# Patient Record
Sex: Female | Born: 1950
Health system: Southern US, Community
[De-identification: ages and names within clinical notes are randomized; demographics above are authoritative.]

## PROBLEM LIST (undated history)

## (undated) DIAGNOSIS — Z9889 Other specified postprocedural states: Secondary | ICD-10-CM

## (undated) DIAGNOSIS — R112 Nausea with vomiting, unspecified: Secondary | ICD-10-CM

## (undated) DIAGNOSIS — M1990 Unspecified osteoarthritis, unspecified site: Secondary | ICD-10-CM

## (undated) DIAGNOSIS — Z87442 Personal history of urinary calculi: Secondary | ICD-10-CM

## (undated) DIAGNOSIS — G4700 Insomnia, unspecified: Secondary | ICD-10-CM

## (undated) DIAGNOSIS — C539 Malignant neoplasm of cervix uteri, unspecified: Secondary | ICD-10-CM

## (undated) DIAGNOSIS — E785 Hyperlipidemia, unspecified: Secondary | ICD-10-CM

## (undated) DIAGNOSIS — H269 Unspecified cataract: Secondary | ICD-10-CM

## (undated) DIAGNOSIS — F329 Major depressive disorder, single episode, unspecified: Secondary | ICD-10-CM

## (undated) DIAGNOSIS — K219 Gastro-esophageal reflux disease without esophagitis: Secondary | ICD-10-CM

## (undated) DIAGNOSIS — Z8541 Personal history of malignant neoplasm of cervix uteri: Secondary | ICD-10-CM

## (undated) DIAGNOSIS — R310 Gross hematuria: Secondary | ICD-10-CM

## (undated) DIAGNOSIS — C679 Malignant neoplasm of bladder, unspecified: Secondary | ICD-10-CM

## (undated) DIAGNOSIS — I10 Essential (primary) hypertension: Secondary | ICD-10-CM

## (undated) HISTORY — DX: Hyperlipidemia, unspecified: E78.5

## (undated) HISTORY — DX: Gastro-esophageal reflux disease without esophagitis: K21.9

## (undated) HISTORY — DX: Unspecified cataract: H26.9

## (undated) HISTORY — DX: Malignant neoplasm of cervix uteri, unspecified: C53.9

---

## 1981-08-27 HISTORY — PX: ABDOMINAL HYSTERECTOMY: SHX81

## 1983-08-28 HISTORY — PX: PERCUTANEOUS NEPHROSTOLITHOTOMY: SHX2207

## 1998-03-09 ENCOUNTER — Other Ambulatory Visit: Admission: RE | Admit: 1998-03-09 | Discharge: 1998-03-09 | Payer: Self-pay | Admitting: Obstetrics and Gynecology

## 2001-04-24 ENCOUNTER — Encounter: Payer: Self-pay | Admitting: Neurosurgery

## 2001-04-24 ENCOUNTER — Encounter: Admission: RE | Admit: 2001-04-24 | Discharge: 2001-04-24 | Payer: Self-pay | Admitting: Neurosurgery

## 2001-05-08 ENCOUNTER — Encounter: Admission: RE | Admit: 2001-05-08 | Discharge: 2001-05-08 | Payer: Self-pay | Admitting: Neurosurgery

## 2001-05-08 ENCOUNTER — Encounter: Payer: Self-pay | Admitting: Neurosurgery

## 2001-05-23 ENCOUNTER — Encounter: Admission: RE | Admit: 2001-05-23 | Discharge: 2001-05-23 | Payer: Self-pay | Admitting: Neurosurgery

## 2001-06-08 ENCOUNTER — Encounter: Payer: Self-pay | Admitting: Neurosurgery

## 2001-06-08 ENCOUNTER — Ambulatory Visit (HOSPITAL_COMMUNITY): Admission: RE | Admit: 2001-06-08 | Discharge: 2001-06-08 | Payer: Self-pay | Admitting: Neurosurgery

## 2001-07-30 ENCOUNTER — Encounter: Payer: Self-pay | Admitting: Neurosurgery

## 2001-08-01 ENCOUNTER — Encounter: Payer: Self-pay | Admitting: Neurosurgery

## 2001-08-01 ENCOUNTER — Inpatient Hospital Stay (HOSPITAL_COMMUNITY): Admission: RE | Admit: 2001-08-01 | Discharge: 2001-08-02 | Payer: Self-pay | Admitting: Neurosurgery

## 2001-08-02 HISTORY — PX: LAMINECTOMY AND MICRODISCECTOMY LUMBAR SPINE: SHX1913

## 2001-09-11 ENCOUNTER — Encounter: Payer: Self-pay | Admitting: Neurosurgery

## 2001-09-11 ENCOUNTER — Ambulatory Visit (HOSPITAL_COMMUNITY): Admission: RE | Admit: 2001-09-11 | Discharge: 2001-09-11 | Payer: Self-pay | Admitting: Neurosurgery

## 2001-09-23 ENCOUNTER — Encounter: Payer: Self-pay | Admitting: Neurosurgery

## 2001-09-23 ENCOUNTER — Inpatient Hospital Stay (HOSPITAL_COMMUNITY): Admission: RE | Admit: 2001-09-23 | Discharge: 2001-09-27 | Payer: Self-pay | Admitting: Neurosurgery

## 2001-09-23 HISTORY — PX: OTHER SURGICAL HISTORY: SHX169

## 2002-04-09 ENCOUNTER — Encounter: Payer: Self-pay | Admitting: Neurosurgery

## 2002-04-09 ENCOUNTER — Encounter: Admission: RE | Admit: 2002-04-09 | Discharge: 2002-04-09 | Payer: Self-pay | Admitting: Neurosurgery

## 2002-08-26 ENCOUNTER — Other Ambulatory Visit: Admission: RE | Admit: 2002-08-26 | Discharge: 2002-08-26 | Payer: Self-pay | Admitting: Gynecology

## 2003-08-25 ENCOUNTER — Other Ambulatory Visit: Admission: RE | Admit: 2003-08-25 | Discharge: 2003-08-25 | Payer: Self-pay | Admitting: Gynecology

## 2009-05-31 ENCOUNTER — Other Ambulatory Visit: Admission: RE | Admit: 2009-05-31 | Discharge: 2009-05-31 | Payer: Self-pay | Admitting: Family Medicine

## 2010-08-14 ENCOUNTER — Encounter: Payer: Self-pay | Admitting: Internal Medicine

## 2010-09-06 ENCOUNTER — Encounter (INDEPENDENT_AMBULATORY_CARE_PROVIDER_SITE_OTHER): Payer: Self-pay | Admitting: *Deleted

## 2010-09-07 ENCOUNTER — Ambulatory Visit
Admission: RE | Admit: 2010-09-07 | Discharge: 2010-09-07 | Payer: Self-pay | Source: Home / Self Care | Attending: Internal Medicine | Admitting: Internal Medicine

## 2010-09-20 ENCOUNTER — Other Ambulatory Visit: Payer: Self-pay | Admitting: Internal Medicine

## 2010-09-20 ENCOUNTER — Ambulatory Visit
Admission: RE | Admit: 2010-09-20 | Discharge: 2010-09-20 | Payer: Self-pay | Source: Home / Self Care | Attending: Internal Medicine | Admitting: Internal Medicine

## 2010-09-25 ENCOUNTER — Encounter: Payer: Self-pay | Admitting: Internal Medicine

## 2010-09-28 NOTE — Miscellaneous (Signed)
Summary: [facility] Previsit/prep  Clinical Lists Changes  Medications: Added new medication of MOVIPREP 100 GM  SOLR (PEG-KCL-NACL-NASULF-NA ASC-C) As per prep instructions. - Signed Rx of MOVIPREP 100 GM  SOLR (PEG-KCL-NACL-NASULF-NA ASC-C) As per prep instructions.;  #1 x 0;  Signed;  Entered by: Wyona Almas RN;  Authorized by: Hilarie Fredrickson MD;  Method used: Electronically to [id number]*, [address] [place], [place], [place]  [address], Ph: [phone], Fax: [phone] Observations: Added new observation of NKA: T (09/07/2010 15:38)    Prescriptions: MOVIPREP 100 GM  SOLR (PEG-KCL-NACL-NASULF-NA ASC-C) As per prep instructions.  #1 x 0   Entered by:   Wyona Almas RN   Authorized by:   Hilarie Fredrickson MD   Signed by:   Wyona Almas RN on 09/07/2010   Method used:   Electronically to        [facility] [phone]* (retail)       [address] [place], [place]  [address]       Ph: [phone]       Fax: [phone]   RxID:   [id number]

## 2010-09-28 NOTE — Letter (Signed)
Summary: [facility] Instructions  [facility]  [address] [place], [place] [address]   Phone: [phone]  Fax: [phone]       BRENNAH QURAISHI    1951/06/06    MRN: [id number]        Procedure Day Dorna Bloom:  [facility]  09/20/10     Arrival Time:  9:30AM     Procedure Time:  10:30AM     Location of Procedure:                    _ X_  [facility] (4th Floor)                      PREPARATION FOR COLONOSCOPY WITH MOVIPREP   Starting 5 days prior to your procedure 09/15/10 do not eat nuts, seeds, popcorn, corn, beans, peas,  salads, or any raw vegetables.  Do not take any fiber supplements (e.g. Metamucil, Citrucel, and Benefiber).  THE DAY BEFORE YOUR PROCEDURE         DATE: 09/19/10  DAY: TUESDAY  1.  Drink clear liquids the entire day-NO SOLID FOOD  2.  Do not drink anything colored red or purple.  Avoid juices with pulp.  No orange juice.  3.  Drink at least 64 oz. (8 glasses) of fluid/clear liquids during the day to prevent dehydration and help the prep work efficiently.  CLEAR LIQUIDS INCLUDE: Water Jello Ice Popsicles Tea (sugar ok, no milk/cream) Powdered fruit flavored drinks Coffee (sugar ok, no milk/cream) Gatorade Juice: apple, white grape, white cranberry  Lemonade Clear bullion, consomm, broth Carbonated beverages (any kind) Strained chicken noodle soup Hard Candy                             4.  In the morning, mix first dose of MoviPrep solution:    Empty 1 Pouch A and 1 Pouch B into the disposable container    Add lukewarm drinking water to the top line of the container. Mix to dissolve    Refrigerate (mixed solution should be used within 24 hrs)  5.  Begin drinking the prep at 5:00 p.m. The MoviPrep container is divided by 4 marks.   Every 15 minutes drink the solution down to the next mark (approximately 8 oz) until the full liter is complete.   6.  Follow completed prep with 16 oz of clear liquid of your choice  (Nothing red or purple).  Continue to drink clear liquids until bedtime.  7.  Before going to bed, mix second dose of MoviPrep solution:    Empty 1 Pouch A and 1 Pouch B into the disposable container    Add lukewarm drinking water to the top line of the container. Mix to dissolve    Refrigerate  THE DAY OF YOUR PROCEDURE      DATE: 09/20/10  DAY: WEDNESDAY  Beginning at 5:30AM (5 hours before procedure):         1. Every 15 minutes, drink the solution down to the next mark (approx 8 oz) until the full liter is complete.  2. Follow completed prep with 16 oz. of clear liquid of your choice.    3. You may drink clear liquids until 8:30AM (2 HOURS BEFORE PROCEDURE).   MEDICATION INSTRUCTIONS  Unless otherwise instructed, you should take regular prescription medications with a small sip of water   as early as possible the morning of your  procedure.   Additional medication instructions: Hold Maxzide HCL the morning of procedure.         OTHER INSTRUCTIONS  You will need a responsible adult at least 59 years of age to accompany you and drive you home.   This person must remain in the waiting room during your procedure.  Wear loose fitting clothing that is easily removed.  Leave jewelry and other valuables at home.  However, you may wish to bring a book to read or  an iPod/MP3 player to listen to music as you wait for your procedure to start.  Remove all body piercing jewelry and leave at home.  Total time from sign-in until discharge is approximately 2-3 hours.  You should go home directly after your procedure and rest.  You can resume normal activities the  day after your procedure.  The day of your procedure you should not:   Drive   Make legal decisions   Operate machinery   Drink alcohol   Return to work  You will receive specific instructions about eating, activities and medications before you leave.    The above instructions have been reviewed and  explained to me by   Wyona Almas RN  September 07, 2010 4:31 PM     I fully understand and can verbalize these instructions _____________________________ Date _________

## 2010-09-28 NOTE — Letter (Signed)
Summary: Pre Visit Letter Revised  [facility]  [address] [place], [place] [address]   Phone: [phone]  Fax: [phone]        08/14/2010 MRN: [id number]  [facility] [address] [place], [place]  [address]             Procedure Date:  1-25- 10:30am           Dr Marina Goodell   Welcome to the Gastroenterology Division at [facility].    You are scheduled to see a nurse for your pre-procedure visit on 09-06-10 at 3:30pm on the 3rd floor at [facility], [address]  We ask that you try to arrive at our office 15 minutes prior to your appointment time to allow for check-in.  Please take a minute to review the attached form.  If you answer "Yes" to one or more of the questions on the first page, we ask that you call the person listed at your earliest opportunity.  If you answer "No" to all of the questions, please complete the rest of the form and bring it to your appointment.    Your nurse visit will consist of discussing your medical and surgical history, your immediate family medical history, and your medications.   If you are unable to list all of your medications on the form, please bring the medication bottles to your appointment and we will list them.  We will need to be aware of both prescribed and over the counter drugs.  We will need to know exact dosage information as well.    Please be prepared to read and sign documents such as consent forms, a financial agreement, and acknowledgement forms.  If necessary, and with your consent, a friend or relative is welcome to sit-in on the nurse visit with you.  Please bring your insurance card so that we may make a copy of it.  If your insurance requires a referral to see a specialist, please bring your referral form from your primary care physician.  No co-pay is required for this nurse visit.     If you cannot keep your appointment, please call [phone] to cancel or reschedule prior to your  appointment date.  This allows [place] the opportunity to schedule an appointment for another patient in need of care.    Thank you for choosing [facility] for your medical needs.  We appreciate the opportunity to care for you.  Please visit [place] at our website  to learn more about our practice.  Sincerely, The Gastroenterology Division

## 2010-09-28 NOTE — Procedures (Addendum)
Summary: Colonoscopy  Patient: [name] Note: All result statuses are Final unless otherwise noted.  Tests: (1) Colonoscopy (COL)   COL Colonoscopy           DONE     [facility]     [address]     [place], [place]  [address]           COLONOSCOPY PROCEDURE REPORT           PATIENT:  Amy Villanueva, Amy Villanueva  MR#:  [id number]     BIRTHDATE:  1950/11/16, 59 yrs. old  GENDER:  female     ENDOSCOPIST:  Wilhemina Bonito. Eda Keys, MD     REF. BY:  Benedetto Goad, MD     PROCEDURE DATE:  09/20/2010     PROCEDURE:  Colonoscopy with snare polypectomy x 1     ASA CLASS:  Class II     INDICATIONS:  Routine Risk Screening     MEDICATIONS:   Fentanyl 100 mcg IV, Versed 10 mg IV, Benadryl 25     mg IV           DESCRIPTION OF PROCEDURE:   After the risks benefits and     alternatives of the procedure were thoroughly explained, informed     consent was obtained.  Digital rectal exam was performed and     revealed no abnormalities.   The LB CF-H180AL [id number] endoscope     was introduced through the anus and advanced to the cecum, which     was identified by both the appendix and ileocecal valve, without     limitations.Time to cecum = 5:11 min.  The quality of the prep was     excellent, using MoviPrep.  The instrument was then slowly     withdrawn (time = 12:44 min) as the colon was fully examined.     <<PROCEDUREIMAGES>>           FINDINGS:  A diminutive polyp was found in the rectum. Polyp was     snared without cautery. Retrieval was successful.   Moderate     diverticulosis was found in the sigmoid colon. This was otherwise     a normal complete colonoscopy.  Retroflexed views in the rectum     revealed internal hemorrhoids.    The scope was then withdrawn     from the patient and the procedure completed.           COMPLICATIONS:  None           ENDOSCOPIC IMPRESSION:     1) Diminutive polyp in the rectum - removed     2) Moderate diverticulosis in the sigmoid colon     3) Internal  hemorrhoids     RECOMMENDATIONS:     1) Repeat colonoscopy in 5 years if polyp adenomatous; otherwise     10 years           ______________________________     Wilhemina Bonito. Eda Keys, MD           CC:  Kathee Delton, PA ([facility]); The Patient           n.     eSIGNED:   Woodroe Vogan N. Eda Keys at 09/20/2010 12:18 PM           Janean Sark, [id number]  Note: An exclamation mark (!) indicates a result that was not dispersed into the flowsheet. Document Creation Date: 09/20/2010 12:19 PM _______________________________________________________________________  (1) Order result  status: Final Collection or observation date-time: 09/20/2010 12:10 Requested date-time:  Receipt date-time:  Reported date-time:  Referring Physician:   Ordering Physician: Fransico Setters [phone]) Specimen Source:  Source: Launa Grill Order Number: [id number] Lab site:   Appended Document: Colonoscopy recall 5 yrs     Procedures Next Due Date:    Colonoscopy: 08/2015

## 2010-10-04 NOTE — Letter (Signed)
Summary: Patient Notice- Polyp Results  [facility]  [address] [place], [place] [address]   Phone: [phone]  Fax: [phone]        September 25, 2010 MRN: [id number]    [facility] [address] [place], [place]  [address]    Dear Ms. Amy Villanueva,  I am pleased to inform you that the colon polyp(s) removed during your recent colonoscopy was (were) found to be benign (no cancer detected) upon pathologic examination.  I recommend you have a repeat colonoscopy examination in 5 years to look for recurrent polyps, as having colon polyps increases your risk for having recurrent polyps or even colon cancer in the future.  Should you develop new or worsening symptoms of abdominal pain, bowel habit changes or bleeding from the rectum or bowels, please schedule an evaluation with either your primary care physician or with me.    Additional information/recommendations:  __ No further action with gastroenterology is needed at this time. Please      follow-up with your primary care physician for your other healthcare      needs.   Please call us if you are having persistent problems or have questions about your condition that have not been fully answered at this time.  Sincerely,  Hilarie Fredrickson MD  This letter has been electronically signed by your physician.  Appended Document: Patient Notice- Polyp Results LETTER MAILED

## 2011-01-12 NOTE — Op Note (Signed)
[place]  Patient:    Amy Villanueva, Amy Villanueva Visit Number: [id number] MRN: [id number]          Service Type: SUR Location: 3000 3040 01 Attending Physician:  Emeterio Reeve Dictated by:   Payton Doughty, M.D. Proc. Date: 09/23/01 Admit Date:  09/23/2001                             Operative Report  PREOPERATIVE DIAGNOSES:  Recurrent disk on the right side at 4-5, extraforaminal disk at 3-4 on the right side, and a small herniated disk at L5-S1 on the right side.  PROCEDURES:  L3-4, L4-5, and L5-S1 laminectomy, diskectomy, and posterior lumbar interbody fusion with Ray threaded fusion cage.  SURGEON:  Payton Doughty, M.D.  NURSE ASSISTANT:  [facility].  DOCTOR ASSISTANT:  Tanya Nones. Jeral Fruit, M.D.  ANESTHESIA:  General endotracheal.  PREP:  Shave and prepped, scrubbed with alcohol wipe.  COMPLICATIONS:  None.  DESCRIPTION OF PROCEDURE:  This is a 60 year old right-handed, white girl with severe lumbar spondylosis, recurrent disk at 4-5, and herniated disk at 3-4. She was taken to the operating room, smoothly anesthetized, intubated and placed prone on the operating table.  Following shave, prepped and draped in usual sterile fashion.  Skin was infiltrated with 1% lidocaine with 1:400,000 epinephrine.  Skin was incised from S1 to bottom of L2.  The lamina of L3, L4, L5, and S1 were exposed bilaterally in a subperiosteal plane out over the 5-1 facet joints.  Intraoperative x-ray confirmed correctness level.  Starting on the right side, the pars interticularis, lamina, and inferior facet of L3, L4, and L5 and the superior facet of L4, L5, and S1 were removed bilaterally and the ligamentum flavum removed.  This facilitated dissection and decompression of the right L3, L4, L5, and S1 nerve roots.  At L3-4, there was an extraforaminal disk, which was removed without difficulty and diskectomy was completed bilaterally.  At L4-5, there was a large disk  recurrence on the right side, which significantly elevated the right five root.  It also interfered with the four root as it entered the neural foramen.  This was removed without difficulty.  At L5-S1, there was a disk herniation on the right side at L5-S1, which significantly elevated the right S1 root.  This was removed without difficulty.  Bilateral diskectomies were completed.  A 14 x 21 mm Ray threaded fusion cages were then placed using the standard technique and protecting the nerve roots.  Intraoperative x-ray showed good placement of cages.  They were packed with bone graft harvested from the facet joints and capped.  The wound was irrigated and hemostasis assured.  The fascia was reapproximated with 0 Vicryl in an interrupted fashion, subcutaneous tissues were reapproximated with 0 Vicryl in an interrupted fashion, the subcuticular tissues were reapproximated with 3-0 Vicryl in an interrupted fashion.  The skin was closed with 3-0 nylon in a running lock fashion.  Betadine, Telfa dressing was applied and made occlusive with OpSite. The patient returned recovery room in good condition. Dictated by:   Payton Doughty, M.D. Attending Physician:  Emeterio Reeve DD:  09/23/01 TD:  09/23/01 Job: [id number]

## 2011-01-12 NOTE — Op Note (Signed)
[place]  Patient:    Amy Villanueva, Amy Villanueva Visit Number: [id number] MRN: [id number]          Service Type: SUR Location: 6700 6733 01 Attending Physician:  Emeterio Reeve Dictated by:   Payton Doughty, M.D. Proc. Date: 08/01/01 Admit Date:  08/01/2001 Discharge Date: 08/02/2001                             Operative Report  PREOPERATIVE DIAGNOSIS:  Herniated disk L5-S1 right.  POSTOPERATIVE DIAGNOSIS:  Herniated disk L5-S1 right.  OPERATION PERFORMED:  Right L5-S1 laminectomy and diskectomy.  SURGEON:  Payton Doughty, M.D.  ANESTHESIA:  General endotracheal.  PREP:  Sterile Betadine prep and scrub with alcohol wipe.  COMPLICATIONS:  None.  DESCRIPTION OF PROCEDURE:  The patient is a 60 year old female with a right L5 radiculopathy secondary to herniated disk at 4-5.  The patient was taken to the operating room, smoothly anesthetized and intubated, and placed prone on the operating table.  Following shave, prep and drape in the usual sterile fashion, the skin was infiltrated with 1% lidocaine with 1:400,000 epinephrine.  The skin was incised from mid L4 to mid L5 and the lamina of L4 exposed on the right side in the subperiosteal plane.  Intraoperative x-ray confirmed correctness of the level.  Hemisemilaminectomy of L4 was carried out to the top of the ligamentum flavum which was removed in retrograde fashion. This allowed dissection of the lateral margin of the right L5 root.  Once the nerve root was dissected free it was retracted medially with a DErrico retractor.  The herniated disk was immediately identified.  The few remaining annular fibers were incised and the disk material was evacuated.  This resulted in immediate decompression of the right L5 root.  The disk space was then carefully explored and all graspable fragments were removed and all marginally clinging fragments were curetted free and removed.  The wound was irrigated and  the nerve root once again explored.  Depo-Medrol soaked fat was used to fill the laminectomy defect.  The fascia was reapproximated with 0 Vicryl in interrupted fashion.  The subcutaneous tissues reapproximated with 3-0 Vicryl in interrupted fashion.  The subcuticular tissues reapproximated with 3-0 Vicryl in interrupted fashion.  The skin was closed with 4-0 Vicryl in running subcuticular fashion.  Benzoin were placed and made occlusive with Telfa and Op-Site.  The patient returned to the recovery room in good condition. Dictated by:   Payton Doughty, M.D. Attending Physician:  Emeterio Reeve DD:  08/01/01 TD:  08/02/01 Job: [id number]

## 2011-01-12 NOTE — Discharge Summary (Signed)
[place]  Patient:    MEHLANI, BLANKENBURG Visit Number: [id number] MRN: [id number]          Service Type: SUR Location: 3000 3040 01 Attending Physician:  Emeterio Reeve Dictated by:   Cristi Loron, M.D. Admit Date:  09/23/2001 Discharge Date: 09/27/2001                             Discharge Summary  Full full details of this admission, please refer to the typed history and physical.  BRIEF HISTORY:  The patient is a 60 year old white female who is about six weeks status post a diskectomy.  She had recurrent pain and was found to have recurrent disk herniation.  For further details of this admission, please refer to the typed history and physical.  HOSPITAL COURSE:  Dr. Channing Mutters admitted the patient to [facility] on September 23, 2001 and on the day of admission performed an L3-4/L4-5/L5-S1 laminectomy with diskectomy and placement of ray cages.  The surgery went well (for further details of this operation, please refer to the typed operative note).  POSTOPERATIVE COURSE:  The patients postoperative course was essentially unremarkable.  She was initially maintained on a PCA pump.  This was discontinued and p.o. Dilaudid was started.  By postop day #4, i.e. September 27, 2001, the patient was afebrile, vital signs were stable, and she was eating well and ambulating well.  Her wound was healing well without signs of infection.  She had normal motor strength and she was requesting discharge to home.  She was therefore discharged on September 27, 2001.  DISCHARGE INSTRUCTIONS:  The patient was given written discharge instructions, instructed to follow up with Dr. Channing Mutters in one week for suture removal.  DISCHARGE MEDICATIONS:  Dilaudid 2 mg #60 one p.o. q.4h. p.r.n. for pain.  No refills.  FINAL DIAGNOSIS:  Recurrent herniated disk L4-5 on the right, foraminal disk herniation at L3-4, and spondylosis at L5-X1.  PROCEDURE PERFORMED:   L3-4/L4-5/L5-S1 laminectomy, diskectomy, and posterior lumbar interbody fusion and placement of ray cages. Dictated by:   Cristi Loron, M.D. Attending Physician:  Emeterio Reeve DD:  09/27/01 TD:  09/29/01 Job: [id number]

## 2011-01-12 NOTE — H&P (Signed)
[place]  Patient:    Amy Villanueva, Amy Villanueva Visit Number: [id number] MRN: [id number]          Service Type: SUR Location: 3000 3040 01 Attending Physician:  Emeterio Reeve Dictated by:   Payton Doughty, M.D. Admit Date:  09/23/2001                           History and Physical  ADMITTING DIAGNOSES:  Recurrent herniated disk on the right at 4-5, foraminal disk at 3-4 on the 5 and spondylosis at L5-S1.  HISTORY OF PRESENT ILLNESS:  A 60 year old right-handed white lady who had a foraminal disk at 3-4, and a large herniated disk at 4-5.  She was operated on about 6 weeks ago and did relatively well and has had an increase in pain in her back and discomfort down her right leg.  AFter bed rest and a trial of steroids MRI was repeated which showed a disk recurrence of 4-5 on the right, an increase in size at 3-4 disk on the right as well as significant spondylosis at 5-1.  She is now admitted for fusion at these levels.  MEDICAL HISTORY:  Remarkable for kidney stones and she has had an operation on her bladder in 1986 and a hysterectomy in 1982.  MEDICATIONS:  Maxzide once a day for hypertension.  ALLERGIES:  She gets nauseated with all pain medications except for Dilaudid.  FAMILY HISTORY:  Her mom died at age 64 of breast cancer.  Her dad is 60 and in terrific health.  SOCIAL HISTORY:  She smokes a pack of cigarettes a day.  Drinks alcohol on a limited social basis.  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  REVIEW OF SYSTEMS:  Remarkable for hypertension, hypercholesterolemia, leg pain, kidney stones, leg weakness, back pain, arm pain, and neck pain.  She has a shoulder spur on the right.  PHYSICAL EXAMINATION:  HEENT:  Within normal limits.  NECK:  She has reasonable range of motion in her neck.  CHEST:  Clear.  CARDIAC:  Regular rate and rhythm.  ABDOMEN:  Nontender.  No  hepatosplenomegaly.  EXTREMITIES:  Without clubbing or cyanosis.  GU:  Deferred.  PERIPHERAL PULSES:  Good.  NEUROLOGIC:  She is awake, alert and oriented. Her cranial nerves are intact.  MOTOR EXAMINATION:  She has 5/5 strength throughout the upper and lower extremities with a mild amount of dorsal flexion and weakness on the right side.  She has right L4 and L5 sensory dysesthesias and mechanical back pain. Straight leg raise is positive on the right.  MRI RESULTS:  Have been reviewed.  CLINICAL IMPRESSION:  Lumbar spondylosis with large recurrent disk, foraminal disk at 3-4.  PLAN:  Lumbar laminectomy, diskectomy, and posterior lumbar interbody fusion at 3-4, 4-5, and 5-1.  The risks and benefits of this approach have been discussed with her and she wishes to proceed. Dictated by:   Payton Doughty, M.D. Attending Physician:  Emeterio Reeve DD:  09/23/01 TD:  09/23/01 Job: [id number]

## 2011-01-12 NOTE — H&P (Signed)
[place]  Patient:    Amy Villanueva, Amy Villanueva Visit Number: [id number] MRN: [id number]          Service Type: Attending:  Payton Doughty, M.D. Dictated by:   Payton Doughty, M.D. Adm. Date:  08/01/01                           History and Physical  ADMITTING DIAGNOSIS:  Herniated disc lumbar vertebrae-4/5.  SERVICE:  Neurosurgery.  HISTORY OF PRESENT ILLNESS:  This is a 60 year old right-handed white lady has been seen since August and June.  She started having pain in her back radiating down her right leg, getting to the side of foot with episodic numbness of the right foot.  There is no precipitating incidence.  The left is infected.  MRI demonstrated a foraminal disc at 3/4 and a disc at L4/5 with elevation of the right 5 root.  She participated in epidural steroids.  She had some increase in her pain.  She was restudied with a MR and it appeared as if the sides of the disc at 4/5 had increased and she is therefore admitted for a 4/5 discectomy.  PAST MEDICAL HISTORY:  Remarkable for a history of kidney stones which she has had basketed and an operation on her bladder April 1986.  She also had a hysterectomy in 1982.  MEDICATIONS:  Maxzide 1 q.d. for hypertension.  ALLERGIES:  She gets nauseated with ALL PAIN MEDICATIONS except for Dilaudid.  FAMILY HISTORY:  Her mom died at 1 with breast cancer.  Her dad is 48 and in terrific health.  SOCIAL HISTORY:  She smokes a pack of cigarettes a day and drinks alcohol on a limited social basis.  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  REVIEW OF SYSTEMS:  Remarkable for hypertension, hypercholesterolemia, leg pain, kidney stones, leg weakness, back pain, arm pain, leg pain, and neck pain.  Apparently, she has had a shoulder spur on the right.  PHYSICAL EXAMINATION:  HEENT:  Within normal limits.  NECK:  She has reasonable range of motion of her neck.  CHEST:   Clear.  CARDIOVASCULAR:  Regular rate and rhythm.  ABDOMEN:  Nontender.  No hepatosplenomegaly.  EXTREMITIES:  Without clubbing, cyanosis, or edema.  GENITOURINARY:  Deferred.  EXTREMITIES:  Peripheral pulses are good.  NEUROLOGICAL:  She is awake, alert, and oriented.  Her cranial nerves are intact.  Motor exam shows 5/5 strength throughout the upper and lower extremities with dorsiflexion weakness on the right and is pain limited. Sensory deficit is in the right L5 distribution.  Reflexes are a flicker at the knees, flicker at the right, 2 at the right ankle, flicker at the left ankle.  Straight leg raising is positive on the right.  LABORATORY DATA:  MR demonstrates as noted above, L3/4 and L4/5 disc and the L3/4 disc is in the extraforaminal position and would be anticipated to affect the L3 root.  The L4/5 disc is in a position to affect the L5 root and the patient has a right L5 radiculopathy.  PLAN:  The plan is for a lumbar laminectomy and discectomy on the right side at lumbar vertebrae-4/5.  The risks and benefits of this approach have been discussed with her and she wishes to proceed. Dictated by:   Payton Doughty, M.D. Attending:  Payton Doughty, M.D. DD:  08/01/01 TD:  08/01/01 Job: [id number]

## 2011-10-18 ENCOUNTER — Other Ambulatory Visit: Payer: Self-pay | Admitting: Urology

## 2011-10-18 ENCOUNTER — Encounter (HOSPITAL_BASED_OUTPATIENT_CLINIC_OR_DEPARTMENT_OTHER): Payer: Self-pay | Admitting: *Deleted

## 2011-10-18 NOTE — Progress Notes (Signed)
NPO AFTER MN. ARRIVES AT 0615. NEEDS ISTAT AND EKG. WILL TAKE PRILOSEC AM OF SURG. W/ SIP OF WATER.

## 2011-10-23 ENCOUNTER — Ambulatory Visit (HOSPITAL_BASED_OUTPATIENT_CLINIC_OR_DEPARTMENT_OTHER): Payer: PRIVATE HEALTH INSURANCE | Admitting: Anesthesiology

## 2011-10-23 ENCOUNTER — Encounter (HOSPITAL_BASED_OUTPATIENT_CLINIC_OR_DEPARTMENT_OTHER): Payer: Self-pay | Admitting: Anesthesiology

## 2011-10-23 ENCOUNTER — Encounter (HOSPITAL_BASED_OUTPATIENT_CLINIC_OR_DEPARTMENT_OTHER)
Admission: RE | Disposition: A | Discharge status: Home / Self Care | Payer: Self-pay | Source: Ambulatory Visit | Attending: Urology

## 2011-10-23 ENCOUNTER — Encounter (HOSPITAL_BASED_OUTPATIENT_CLINIC_OR_DEPARTMENT_OTHER): Payer: Self-pay | Admitting: *Deleted

## 2011-10-23 ENCOUNTER — Ambulatory Visit (HOSPITAL_BASED_OUTPATIENT_CLINIC_OR_DEPARTMENT_OTHER)
Admission: RE | Admit: 2011-10-23 | Discharge: 2011-10-23 | Disposition: A | Discharge status: Home / Self Care | Payer: PRIVATE HEALTH INSURANCE | Source: Ambulatory Visit | Attending: Urology | Admitting: Urology

## 2011-10-23 DIAGNOSIS — Z90710 Acquired absence of both cervix and uterus: Secondary | ICD-10-CM | POA: Insufficient documentation

## 2011-10-23 DIAGNOSIS — Z8541 Personal history of malignant neoplasm of cervix uteri: Secondary | ICD-10-CM | POA: Insufficient documentation

## 2011-10-23 DIAGNOSIS — C679 Malignant neoplasm of bladder, unspecified: Secondary | ICD-10-CM | POA: Insufficient documentation

## 2011-10-23 DIAGNOSIS — R310 Gross hematuria: Secondary | ICD-10-CM | POA: Insufficient documentation

## 2011-10-23 DIAGNOSIS — Z79899 Other long term (current) drug therapy: Secondary | ICD-10-CM | POA: Insufficient documentation

## 2011-10-23 DIAGNOSIS — D414 Neoplasm of uncertain behavior of bladder: Secondary | ICD-10-CM | POA: Insufficient documentation

## 2011-10-23 DIAGNOSIS — E7800 Pure hypercholesterolemia, unspecified: Secondary | ICD-10-CM | POA: Insufficient documentation

## 2011-10-23 DIAGNOSIS — I10 Essential (primary) hypertension: Secondary | ICD-10-CM | POA: Insufficient documentation

## 2011-10-23 HISTORY — DX: Unspecified osteoarthritis, unspecified site: M19.90

## 2011-10-23 HISTORY — DX: Major depressive disorder, single episode, unspecified: F32.9

## 2011-10-23 HISTORY — PX: CYSTOSCOPY WITH BIOPSY: SHX5122

## 2011-10-23 HISTORY — DX: Other specified postprocedural states: R11.2

## 2011-10-23 HISTORY — DX: Malignant neoplasm of bladder, unspecified: C67.9

## 2011-10-23 HISTORY — DX: Other specified postprocedural states: Z98.890

## 2011-10-23 HISTORY — DX: Gross hematuria: R31.0

## 2011-10-23 HISTORY — DX: Insomnia, unspecified: G47.00

## 2011-10-23 HISTORY — DX: Essential (primary) hypertension: I10

## 2011-10-23 HISTORY — DX: Personal history of urinary calculi: Z87.442

## 2011-10-23 HISTORY — DX: Personal history of malignant neoplasm of cervix uteri: Z85.41

## 2011-10-23 LAB — POCT I-STAT 4, (NA,K, GLUC, HGB,HCT)
Potassium: 3.4 mEq/L — ABNORMAL LOW (ref 3.5–5.1)
Sodium: 145 mEq/L (ref 135–145)

## 2011-10-23 SURGERY — CYSTOSCOPY, WITH BIOPSY
Anesthesia: General | Site: Bladder | Wound class: Clean Contaminated

## 2011-10-23 MED ORDER — STERILE WATER FOR IRRIGATION IR SOLN
Status: DC | PRN
  Administered 2011-10-23: 3000 mL

## 2011-10-23 MED ORDER — FENTANYL CITRATE 0.05 MG/ML IJ SOLN: 25.0000 ug | INTRAMUSCULAR | Status: DC | PRN

## 2011-10-23 MED ORDER — DROPERIDOL 2.5 MG/ML IJ SOLN
INTRAMUSCULAR | Status: DC | PRN
  Administered 2011-10-23: 0.625 mg via INTRAVENOUS

## 2011-10-23 MED ORDER — KETOROLAC TROMETHAMINE 30 MG/ML IJ SOLN: 15.0000 mg | Freq: Once | INTRAMUSCULAR | Status: DC | PRN

## 2011-10-23 MED ORDER — MIDAZOLAM HCL 5 MG/5ML IJ SOLN
INTRAMUSCULAR | Status: DC | PRN
  Administered 2011-10-23: 2 mg via INTRAVENOUS

## 2011-10-23 MED ORDER — GENTAMICIN IN SALINE 1.6-0.9 MG/ML-% IV SOLN
80.0000 mg | Freq: Once | INTRAVENOUS | Status: AC
  Administered 2011-10-23: 80 mg via INTRAVENOUS

## 2011-10-23 MED ORDER — FENTANYL CITRATE 0.05 MG/ML IJ SOLN
INTRAMUSCULAR | Status: DC | PRN
  Administered 2011-10-23: 50 ug via INTRAVENOUS

## 2011-10-23 MED ORDER — PROMETHAZINE HCL 25 MG/ML IJ SOLN: 6.2500 mg | INTRAMUSCULAR | Status: DC | PRN

## 2011-10-23 MED ORDER — DEXAMETHASONE SODIUM PHOSPHATE 4 MG/ML IJ SOLN
INTRAMUSCULAR | Status: DC | PRN
  Administered 2011-10-23: 8 mg via INTRAVENOUS

## 2011-10-23 MED ORDER — CIPROFLOXACIN HCL 250 MG PO TABS: 250.0000 mg | ORAL_TABLET | Freq: Two times a day (BID) | ORAL | Status: AC

## 2011-10-23 MED ORDER — PROPOFOL 10 MG/ML IV EMUL
INTRAVENOUS | Status: DC | PRN
  Administered 2011-10-23: 50 mg via INTRAVENOUS
  Administered 2011-10-23: 200 mg via INTRAVENOUS

## 2011-10-23 MED ORDER — LACTATED RINGERS IV SOLN
INTRAVENOUS | Status: DC
  Administered 2011-10-23: 07:00:00 via INTRAVENOUS

## 2011-10-23 MED ORDER — HYDROCODONE-ACETAMINOPHEN 5-500 MG PO TABS: 1.0000 | ORAL_TABLET | Freq: Four times a day (QID) | ORAL | Status: AC | PRN

## 2011-10-23 MED ORDER — LIDOCAINE HCL (CARDIAC) 20 MG/ML IV SOLN
INTRAVENOUS | Status: DC | PRN
  Administered 2011-10-23: 80 mg via INTRAVENOUS

## 2011-10-23 MED ORDER — ONDANSETRON HCL 4 MG/2ML IJ SOLN
INTRAMUSCULAR | Status: DC | PRN
  Administered 2011-10-23: 4 mg via INTRAVENOUS

## 2011-10-23 SURGICAL SUPPLY — 21 items
BAG DRAIN URO-CYSTO SKYTR STRL (DRAIN) ×2 IMPLANT
CANISTER SUCT LVC 12 LTR MEDI- (MISCELLANEOUS) ×2 IMPLANT
CATH ROBINSON RED A/P 12FR (CATHETERS) IMPLANT
CATH ROBINSON RED A/P 14FR (CATHETERS) IMPLANT
CLOTH BEACON ORANGE TIMEOUT ST (SAFETY) ×2 IMPLANT
DRAPE CAMERA CLOSED 9X96 (DRAPES) ×2 IMPLANT
ELECT REM PT RETURN 9FT ADLT (ELECTROSURGICAL) ×2
ELECTRODE REM PT RTRN 9FT ADLT (ELECTROSURGICAL) ×1 IMPLANT
GLOVE BIO SURGEON STRL SZ7.5 (GLOVE) ×4 IMPLANT
GLOVE BIOGEL PI IND STRL 6.5 (GLOVE) ×2 IMPLANT
GLOVE BIOGEL PI IND STRL 7.0 (GLOVE) ×1 IMPLANT
GLOVE BIOGEL PI INDICATOR 6.5 (GLOVE) ×2
GLOVE BIOGEL PI INDICATOR 7.0 (GLOVE) ×1
GOWN PREVENTION PLUS XLARGE (GOWN DISPOSABLE) ×2 IMPLANT
GOWN STRL NON-REIN LRG LVL3 (GOWN DISPOSABLE) ×4 IMPLANT
GOWN STRL REIN XL XLG (GOWN DISPOSABLE) ×2 IMPLANT
NDL SAFETY ECLIPSE 18X1.5 (NEEDLE) IMPLANT
NEEDLE HYPO 18GX1.5 SHARP (NEEDLE)
PACK CYSTOSCOPY (CUSTOM PROCEDURE TRAY) ×2 IMPLANT
SYR 20CC LL (SYRINGE) IMPLANT
WATER STERILE IRR 3000ML UROMA (IV SOLUTION) ×2 IMPLANT

## 2011-10-23 NOTE — H&P (Signed)
History of Present Illness   I saw Amy Villanueva a year and a half ago for one episode of blood in the urine. She has had what sounds like a ureteral reimplantation many years ago and a distant history of kidney stones. For cost reasons, in spite of counseling, she did not have a cystoscopy and CT Scan. Her culture was negative and it cleared up with an antibiotic.   Over the last 4-5 days she has had gross hematuria with a few clots. She was clear on Tuesday but saw blood again today. She quit smoking 4 years ago after smoking for 37 years. She is not having pain, dysuria, pressure or increasing frequency.  The bleeding came on acutely. There is no other modifying factors or associated signs or symptoms. There is no other aggravating or relieving factors. It is moderate in severity and persistent.   Urinalysis: I reviewed, 4-6 red blood cells per high power field. No bacteria. Urine was sent for culture.   Review of systems: No change in bowel or neurologic status.   I recommended a full workup for Amy Villanueva including a CT scan, BUN and creatinine and cystoscopy today.    Past Medical History Problems  1. History of  Anxiety (Symptom) 300.00 2. History of  Arthritis V13.4 3. History of  Cervical Cancer V10.41 4. Former Smoker V15.82 5. History of  Heartburn 787.1 6. History of  Hypercholesterolemia 272.0 7. History of  Hypertension 401.9  Surgical History Problems  1. History of  Back Surgery 2. History of  Bladder Surgery 3. History of  Hysterectomy V45.77 4. History of  Kidney Surgery  Current Meds 1. Halcion TABS; Therapy: (Recorded:13Jul2011) to 2. PriLOSEC OTC TBEC; Therapy: (Recorded:13Jul2011) to 3. Wellbutrin TABS; Therapy: (Recorded:13Jul2011) to  Allergies Medication  1. No Known Drug Allergies  Family History Problems  1. Maternal history of  Breast Cancer V16.3 2. Maternal history of  Tuberculosis  Social History Problems  1. Caffeine Use 4 cups a d 2.  Marital History - Single 3. 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 History of  Alcohol Use  Vitals Vital Signs [Data Includes: Last 1 Day]  21Feb2013 01:25PM  BMI Calculated: 23.87 BSA Calculated: 1.73 Height: 5 ft 5.5 in Weight: 145 lb  Blood Pressure: 120 / 80 Temperature: 98.1 F Heart Rate: 84  Physical Exam Constitutional: Well nourished and well developed . No acute distress.  ENT:. The ears and nose are normal in appearance.  Neck: The appearance of the neck is normal and no neck mass is present.  Pulmonary: No respiratory distress and normal respiratory rhythm and effort.  Cardiovascular: Heart rate and rhythm are normal . No peripheral edema.  Abdomen: The abdomen is soft and nontender. No masses are palpated. No CVA tenderness. No hernias are palpable. No hepatosplenomegaly noted.  Lymphatics: The femoral and inguinal nodes are not enlarged or tender.  Skin: Normal skin turgor, no visible rash and no visible skin lesions.  Neuro/Psych:. Mood and affect are appropriate.   . Genitourinary: On pelvic examination Amy Villanueva had grade 2 hypermobility of the bladder neck with no cystocele and no stress incontinence.    Results/Data   Cystoscopy: Flexible cystoscopy with sterile technique was utilized. Consent was given. The trigone and ureters were normal. There is no blood in the bladder. She had approximately a 1 cm papillary lesion at 5 o'clock near the posterior wall and it is interfaced with the floor of the bladder. It was on a stalk. It looked like  it had bled in the past. There was no evidence of carcinoma in situ or any other cancers. There is no associated cystitis. Urine [Data Includes: Last 1 Day]   21Feb2013  COLOR YELLOW   APPEARANCE CLEAR   SPECIFIC GRAVITY 1.020   pH 6.0   GLUCOSE NEG mg/dL  BILIRUBIN NEG   KETONE NEG mg/dL  BLOOD SMALL   PROTEIN NEG mg/dL  UROBILINOGEN 0.2 mg/dL  NITRITE NEG   LEUKOCYTE ESTERASE NEG   SQUAMOUS EPITHELIAL/HPF RARE     WBC NONE SEEN WBC/hpf  RBC 4-6 RBC/hpf  BACTERIA NONE SEEN   CRYSTALS NONE SEEN   CASTS NONE SEEN    Assessment Assessed  1. Gross Hematuria 599.71 2. Bladder Cancer 188.9  Plan Health Maintenance (V70.0)  1. UA With REFLEX  Done: 21Feb2013 01:00PM  Discussion/Summary   I drew Amy Villanueva a picture. She should still have a CT scan of the upper tracts to rule out upper tract transitional carcinoma. She is going on a cruise in 2 weeks and we will still do the delayed films for stone disease. I drew her a picture. I went over a bladder biopsy and/or resection. In my opinion she only needed a biopsy. She understands the difference between superficial and deep carcinoma. She is going on a cruise in 2 weeks and I will do this as soon as possible early next week. Risks of procedure were discussed including hematuria, urinary tract infection, bladder perforation requiring surgery, pain and recurrence rates.   Eventually I will have her followed by one of my partners.  After a thorough review of the management options for the patient's condition the patient  elected to proceed with surgical therapy as noted above. We have discussed the potential benefits and risks of the procedure, side effects of the proposed treatment, the likelihood of the patient achieving the goals of the procedure, and any potential problems that might occur during the procedure or recuperation. Informed consent has been obtained.

## 2011-10-23 NOTE — Interval H&P Note (Signed)
History and Physical Interval Note:  10/23/2011 7:06 AM  Amy Villanueva  has presented today for surgery, with the diagnosis of BLADDER CANCER  The various methods of treatment have been discussed with the patient and family. After consideration of risks, benefits and other options for treatment, the patient has consented to  Procedure(s) (LRB): CYSTOSCOPY WITH BIOPSY (N/A) as a surgical intervention .  The patients' history has been reviewed, patient examined, no change in status, stable for surgery.  I have reviewed the patients' chart and labs.  Questions were answered to the patient's satisfaction.     Siah Steely A

## 2011-10-23 NOTE — Anesthesia Postprocedure Evaluation (Signed)
  Anesthesia Post-op Note  Patient: Amy Villanueva  Procedure(s) Performed: Procedure(s) (LRB): CYSTOSCOPY WITH BIOPSY (N/A)  Patient Location: PACU  Anesthesia Type: General  Level of Consciousness: awake and alert   Airway and Oxygen Therapy: Patient Spontanous Breathing  Post-op Pain: mild  Post-op Assessment: Post-op Vital signs reviewed, Patient's Cardiovascular Status Stable, Respiratory Function Stable, Patent Airway and No signs of Nausea or vomiting  Post-op Vital Signs: stable  Complications: No apparent anesthesia complications

## 2011-10-23 NOTE — Anesthesia Procedure Notes (Signed)
Procedure Name: LMA Insertion Date/Time: 10/23/2011 7:41 AM Performed by: Renella Cunas D Pre-anesthesia Checklist: Patient identified, Emergency Drugs available, Suction available and Patient being monitored Patient Re-evaluated:Patient Re-evaluated prior to inductionOxygen Delivery Method: Circle System Utilized Preoxygenation: Pre-oxygenation with 100% oxygen Intubation Type: IV induction Ventilation: Mask ventilation without difficulty LMA: LMA inserted LMA Size: 4.0 Number of attempts: 2 Airway Equipment and Method: bite block Placement Confirmation: positive ETCO2 Tube secured with: Tape Dental Injury: Teeth and Oropharynx as per pre-operative assessment

## 2011-10-23 NOTE — Anesthesia Preprocedure Evaluation (Signed)
Anesthesia Evaluation  Patient identified by MRN, date of birth, ID band Patient awake    Reviewed: Allergy & Precautions, H&P , NPO status , Patient's Chart, lab work & pertinent test results  History of Anesthesia Complications (+) PONV  Airway Mallampati: II TM Distance: >3 FB Neck ROM: Full    Dental No notable dental hx.    Pulmonary former smoker clear to auscultation  Pulmonary exam normal       Cardiovascular hypertension, Regular Normal    Neuro/Psych Negative Neurological ROS  Negative Psych ROS   GI/Hepatic negative GI ROS, Neg liver ROS,   Endo/Other  Negative Endocrine ROS  Renal/GU negative Renal ROS  Genitourinary negative   Musculoskeletal negative musculoskeletal ROS (+)   Abdominal   Peds negative pediatric ROS (+)  Hematology negative hematology ROS (+)   Anesthesia Other Findings   Reproductive/Obstetrics negative OB ROS                           Anesthesia Physical Anesthesia Plan  ASA: II  Anesthesia Plan: General   Post-op Pain Management:    Induction: Intravenous  Airway Management Planned: LMA  Additional Equipment:   Intra-op Plan:   Post-operative Plan:   Informed Consent: I have reviewed the patients History and Physical, chart, labs and discussed the procedure including the risks, benefits and alternatives for the proposed anesthesia with the patient or authorized representative who has indicated his/her understanding and acceptance.   Dental advisory given  Plan Discussed with: CRNA  Anesthesia Plan Comments:         Anesthesia Quick Evaluation

## 2011-10-23 NOTE — Op Note (Signed)
Preoperative diagnosis: Superficial bladder cancer Postoperative diagnosis: Superficial bladder cancer Surgery bladder biopsy with fulguration plus cystoscopy Surgeon Dr. Lorin Picket Chike Farrington  The patient has the above diagnoses. She was given preoperative antibiotics. Extra care was taken with leg positioning  21 [place] scope was utilized. Extensive cystoscopy demonstrated no carcinoma in situ and no cancer other than a 2 x 2 superficial polyp on a stalk at 5:00 along the left posterior lateral wall. Her reimplanted ureter was in the left dome of the bladder posteriorly away from the tumor. The rest the trigone was normal  Biopsy forceps easily removed the tumor with a fairy superficial bite of mucosa and submucosa. It was clear that the tumor was not invading the muscle but hopefully a small amount of muscle was obtained. The base was fulgurated.  The bladder was emptied and I reinspected and there was no bleeding. Surprisingly she had diffuse glomerulations similar to those in patient with interstitial cystitis. Her bladder was not overdistended during the procedure.  Otherwise there was no stitch form body or other carcinoma. Bladder was emptied. Patient taken to recovery room.  She will be followed as per protocol

## 2011-10-23 NOTE — Transfer of Care (Signed)
Immediate Anesthesia Transfer of Care Note  Patient: Amy Villanueva  Procedure(s) Performed: Procedure(s) (LRB): CYSTOSCOPY WITH BIOPSY (N/A)  Patient Location: PACU  Anesthesia Type: General  Level of Consciousness: awake, oriented, sedated and patient cooperative  Airway & Oxygen Therapy: Patient Spontanous Breathing and Patient connected to face mask oxygen  Post-op Assessment: Report given to PACU RN and Post -op Vital signs reviewed and stable  Post vital signs: Reviewed and stable  Complications: No apparent anesthesia complications

## 2011-10-23 NOTE — Discharge Instructions (Signed)
I have reviewed discharge instructions in detail with the patient. They will follow-up with me or their physician as scheduled. My nurse will also be calling the patients as per protocol.   

## 2011-10-23 NOTE — Progress Notes (Signed)
Patient does not need an EKG per Dr. Okey Dupre

## 2011-10-24 ENCOUNTER — Encounter (HOSPITAL_BASED_OUTPATIENT_CLINIC_OR_DEPARTMENT_OTHER): Payer: Self-pay | Admitting: Urology

## 2015-10-19 ENCOUNTER — Encounter: Payer: Self-pay | Admitting: Internal Medicine

## 2015-11-08 ENCOUNTER — Encounter: Payer: Self-pay | Admitting: Internal Medicine

## 2015-12-30 ENCOUNTER — Encounter: Payer: Self-pay | Admitting: Internal Medicine

## 2015-12-30 ENCOUNTER — Ambulatory Visit (AMBULATORY_SURGERY_CENTER): Payer: Self-pay | Admitting: *Deleted

## 2015-12-30 VITALS — Ht 65.0 in | Wt 145.2 lb

## 2015-12-30 DIAGNOSIS — Z86010 Personal history of colonic polyps: Secondary | ICD-10-CM

## 2015-12-30 MED ORDER — NA SULFATE-K SULFATE-MG SULF 17.5-3.13-1.6 GM/177ML PO SOLN: 1.0000 | Freq: Once | ORAL | Status: DC

## 2015-12-30 NOTE — Progress Notes (Signed)
Denies allergies to eggs or soy products. Denies complications with sedation or anesthesia. Denies O2 use. Denies use of diet or weight loss medications.  Emmi instructions given for colonoscopy.  

## 2016-01-02 DIAGNOSIS — K219 Gastro-esophageal reflux disease without esophagitis: Secondary | ICD-10-CM | POA: Insufficient documentation

## 2016-01-02 DIAGNOSIS — I10 Essential (primary) hypertension: Secondary | ICD-10-CM | POA: Insufficient documentation

## 2016-01-09 ENCOUNTER — Telehealth: Payer: Self-pay | Admitting: Internal Medicine

## 2016-01-09 NOTE — Telephone Encounter (Signed)
Left message for patient that I would put a prep sample up front for patient to pick up.

## 2016-01-13 ENCOUNTER — Encounter: Payer: Self-pay | Admitting: Internal Medicine

## 2016-01-13 ENCOUNTER — Ambulatory Visit (AMBULATORY_SURGERY_CENTER): Payer: BLUE CROSS/BLUE SHIELD | Admitting: Internal Medicine

## 2016-01-13 VITALS — BP 135/68 | HR 65 | Temp 97.8°F | Resp 11 | Ht 65.0 in | Wt 145.0 lb

## 2016-01-13 DIAGNOSIS — D120 Benign neoplasm of cecum: Secondary | ICD-10-CM | POA: Diagnosis not present

## 2016-01-13 DIAGNOSIS — Z86010 Personal history of colonic polyps: Secondary | ICD-10-CM

## 2016-01-13 DIAGNOSIS — D123 Benign neoplasm of transverse colon: Secondary | ICD-10-CM

## 2016-01-13 DIAGNOSIS — D125 Benign neoplasm of sigmoid colon: Secondary | ICD-10-CM

## 2016-01-13 DIAGNOSIS — D122 Benign neoplasm of ascending colon: Secondary | ICD-10-CM

## 2016-01-13 DIAGNOSIS — K635 Polyp of colon: Secondary | ICD-10-CM | POA: Diagnosis not present

## 2016-01-13 MED ORDER — SODIUM CHLORIDE 0.9 % IV SOLN: 500.0000 mL | INTRAVENOUS | Status: DC

## 2016-01-13 NOTE — Patient Instructions (Signed)
YOU HAD AN ENDOSCOPIC PROCEDURE TODAY AT THE [facility]:   Refer to the procedure report that was given to you for any specific questions about what was found during the examination.  If the procedure report does not answer your questions, please call your gastroenterologist to clarify.  If you requested that your care partner not be given the details of your procedure findings, then the procedure report has been included in a sealed envelope for you to review at your convenience later.  YOU SHOULD EXPECT: Some feelings of bloating in the abdomen. Passage of more gas than usual.  Walking can help get rid of the air that was put into your GI tract during the procedure and reduce the bloating. If you had a lower endoscopy (such as a colonoscopy or flexible sigmoidoscopy) you may notice spotting of blood in your stool or on the toilet paper. If you underwent a bowel prep for your procedure, you may not have a normal bowel movement for a few days.  Please Note:  You might notice some irritation and congestion in your nose or some drainage.  This is from the oxygen used during your procedure.  There is no need for concern and it should clear up in a day or so.  SYMPTOMS TO REPORT IMMEDIATELY:   Following lower endoscopy (colonoscopy or flexible sigmoidoscopy):  Excessive amounts of blood in the stool  Significant tenderness or worsening of abdominal pains  Swelling of the abdomen that is new, acute  Fever of 100F or higher    For urgent or emergent issues, a gastroenterologist can be reached at any hour by calling [phone].   DIET: Your first meal following the procedure should be a small meal and then it is ok to progress to your normal diet. Heavy or fried foods are harder to digest and may make you feel nauseous or bloated.  Likewise, meals heavy in dairy and vegetables can increase bloating.  Drink plenty of fluids but you should avoid alcoholic beverages for 24  hours.  ACTIVITY:  You should plan to take it easy for the rest of today and you should NOT DRIVE or use heavy machinery until tomorrow (because of the sedation medicines used during the test).    FOLLOW UP: Our staff will call the number listed on your records the next business day following your procedure to check on you and address any questions or concerns that you may have regarding the information given to you following your procedure. If we do not reach you, we will leave a message.  However, if you are feeling well and you are not experiencing any problems, there is no need to return our call.  We will assume that you have returned to your regular daily activities without incident.  If any biopsies were taken you will be contacted by phone or by letter within the next 1-3 weeks.  Please call us at [phone] if you have not heard about the biopsies in 3 weeks.    SIGNATURES/CONFIDENTIALITY: You and/or your care partner have signed paperwork which will be entered into your electronic medical record.  These signatures attest to the fact that that the information above on your After Visit Summary has been reviewed and is understood.  Full responsibility of the confidentiality of this discharge information lies with you and/or your care-partner.    INFORMATION ON POLYPS,DIVERTICULOSIS ,AND HIGH FIBER DIET GIVEN TO YOU TODAY 

## 2016-01-13 NOTE — Progress Notes (Signed)
Called to room to assist during endoscopic procedure.  Patient ID and intended procedure confirmed with present staff. Received instructions for my participation in the procedure from the performing physician.  

## 2016-01-13 NOTE — Op Note (Signed)
[place] Patient Name: Amy Villanueva Procedure Date: 01/13/2016 8:45 AM MRN: [id number] Endoscopist: Docia Chuck. Henrene Pastor , MD Age: 65 Referring MD:  Date of Birth: November 16, 1950 Gender: Female Procedure:                Colonoscopy, with cold snare polypectomy Indications:              Surveillance: Personal history of adenomatous                            polyps on last colonoscopy 5 years ago Medicines:                Monitored Anesthesia Care Procedure:                Pre-Anesthesia Assessment:                           - Prior to the procedure, a History and Physical                            was performed, and patient medications and                            allergies were reviewed. The patient's tolerance of                            previous anesthesia was also reviewed. The risks                            and benefits of the procedure and the sedation                            options and risks were discussed with the patient.                            All questions were answered, and informed consent                            was obtained. Prior Anticoagulants: The patient has                            taken no previous anticoagulant or antiplatelet                            agents. ASA Grade Assessment: II - A patient with                            mild systemic disease. After reviewing the risks                            and benefits, the patient was deemed in                            satisfactory condition to undergo the procedure.  After obtaining informed consent, the colonoscope                            was passed under direct vision. Throughout the                            procedure, the patient's blood pressure, pulse, and                            oxygen saturations were monitored continuously. The                            Model CF-HQ190L [id number]) scope was introduced                            through the anus  and advanced to the the cecum,                            identified by appendiceal orifice and ileocecal                            valve. The ileocecal valve, appendiceal orifice,                            and rectum were photographed. The quality of the                            bowel preparation was excellent. The colonoscopy                            was performed without difficulty. The patient                            tolerated the procedure well. The bowel preparation                            used was SUPREP. Scope In: 8:54:02 AM Scope Out: 9:14:06 AM Scope Withdrawal Time: 0 hours 17 minutes 3 seconds  Total Procedure Duration: 0 hours 20 minutes 4 seconds  Findings:                 Five polyps were found in the sigmoid colon,                            transverse colon, ascending colon and cecum. The                            polyps were 2 to 6 mm in size. These polyps were                            removed with a cold snare. Largest polypectomy site                            cauterized for minor persistent oozing.  Resection                            and retrieval were complete.                           Multiple small and large-mouthed diverticula were                            found in the sigmoid colon.                           The exam was otherwise without abnormality on                            direct and retroflexion views. Internal hemorrhoids                            present. Complications:            No immediate complications. Estimated blood loss:                            None. Estimated Blood Loss:     Estimated blood loss: none. Impression:               - Five 2 to 6 mm polyps in the sigmoid colon, in                            the transverse colon, in the ascending colon and in                            the cecum, removed with a cold snare. Resected and                            retrieved.                           - Diverticulosis in the  sigmoid colon.                           - The examination was otherwise normal on direct                            and retroflexion views. Recommendation:           - Repeat colonoscopy in 3 years for surveillance.                           - Await pathology results. Docia Chuck. Henrene Pastor, MD 01/13/2016 9:21:58 AM This report has been signed electronically. CC Letter to:             Aletha Halim

## 2016-01-13 NOTE — Progress Notes (Signed)
A/ox3 pleased with MAC, report to Penny RN 

## 2016-01-16 ENCOUNTER — Telehealth: Payer: Self-pay | Admitting: *Deleted

## 2016-01-16 NOTE — Telephone Encounter (Signed)
  Follow up Call-  Call back number 01/13/2016  Post procedure Call Back phone  # [phone]  Permission to leave phone message Yes     Patient questions:  Do you have a fever, pain , or abdominal swelling? No. Pain Score  0 *  Have you tolerated food without any problems? Yes.    Have you been able to return to your normal activities? Yes.    Do you have any questions about your discharge instructions: Diet   No. Medications  No. Follow up visit  No.  Do you have questions or concerns about your Care? No.  Actions: * If pain score is 4 or above: No action needed, pain <4.  Patient stating she did not like Suprep.

## 2016-01-18 ENCOUNTER — Encounter: Payer: Self-pay | Admitting: Internal Medicine

## 2016-03-06 DIAGNOSIS — E7800 Pure hypercholesterolemia, unspecified: Secondary | ICD-10-CM | POA: Insufficient documentation

## 2016-03-06 DIAGNOSIS — M1990 Unspecified osteoarthritis, unspecified site: Secondary | ICD-10-CM | POA: Insufficient documentation

## 2016-05-18 ENCOUNTER — Other Ambulatory Visit: Payer: Self-pay | Admitting: Physician Assistant

## 2016-05-18 DIAGNOSIS — R1011 Right upper quadrant pain: Secondary | ICD-10-CM

## 2016-05-28 ENCOUNTER — Ambulatory Visit
Admission: RE | Admit: 2016-05-28 | Discharge: 2016-05-28 | Disposition: A | Discharge status: Home / Self Care | Payer: BLUE CROSS/BLUE SHIELD | Source: Ambulatory Visit | Attending: Physician Assistant | Admitting: Physician Assistant

## 2016-05-28 DIAGNOSIS — R1011 Right upper quadrant pain: Secondary | ICD-10-CM

## 2016-12-12 DIAGNOSIS — I10 Essential (primary) hypertension: Secondary | ICD-10-CM | POA: Diagnosis not present

## 2016-12-12 DIAGNOSIS — R7301 Impaired fasting glucose: Secondary | ICD-10-CM | POA: Diagnosis not present

## 2016-12-12 DIAGNOSIS — E7800 Pure hypercholesterolemia, unspecified: Secondary | ICD-10-CM | POA: Diagnosis not present

## 2016-12-24 DIAGNOSIS — G4700 Insomnia, unspecified: Secondary | ICD-10-CM | POA: Diagnosis not present

## 2016-12-24 DIAGNOSIS — Z720 Tobacco use: Secondary | ICD-10-CM | POA: Diagnosis not present

## 2016-12-24 DIAGNOSIS — I10 Essential (primary) hypertension: Secondary | ICD-10-CM | POA: Diagnosis not present

## 2016-12-24 DIAGNOSIS — E7800 Pure hypercholesterolemia, unspecified: Secondary | ICD-10-CM | POA: Diagnosis not present

## 2016-12-24 DIAGNOSIS — Z716 Tobacco abuse counseling: Secondary | ICD-10-CM | POA: Diagnosis not present

## 2017-01-29 DIAGNOSIS — R0781 Pleurodynia: Secondary | ICD-10-CM | POA: Diagnosis not present

## 2017-02-18 ENCOUNTER — Other Ambulatory Visit: Payer: Self-pay | Admitting: Family Medicine

## 2017-02-18 DIAGNOSIS — R0789 Other chest pain: Secondary | ICD-10-CM

## 2017-02-20 ENCOUNTER — Other Ambulatory Visit: Payer: BLUE CROSS/BLUE SHIELD

## 2017-02-22 ENCOUNTER — Ambulatory Visit
Admission: RE | Admit: 2017-02-22 | Discharge: 2017-02-22 | Disposition: A | Discharge status: Home / Self Care | Payer: PPO | Source: Ambulatory Visit | Attending: Family Medicine | Admitting: Family Medicine

## 2017-02-22 DIAGNOSIS — R0789 Other chest pain: Secondary | ICD-10-CM | POA: Diagnosis not present

## 2017-03-13 ENCOUNTER — Telehealth: Payer: Self-pay | Admitting: Physician Assistant

## 2017-03-13 DIAGNOSIS — Z0000 Encounter for general adult medical examination without abnormal findings: Secondary | ICD-10-CM | POA: Diagnosis not present

## 2017-03-13 NOTE — Telephone Encounter (Signed)
Received records from [facility] for appointment on 03/15/17 with Almyra Deforest, PA.  Records put with Hao's schedule for 03/15/17. lp

## 2017-03-14 ENCOUNTER — Ambulatory Visit: Payer: PPO | Admitting: Physician Assistant

## 2017-03-15 ENCOUNTER — Ambulatory Visit (INDEPENDENT_AMBULATORY_CARE_PROVIDER_SITE_OTHER): Payer: PPO | Admitting: Physician Assistant

## 2017-03-15 ENCOUNTER — Encounter: Payer: Self-pay | Admitting: Physician Assistant

## 2017-03-15 VITALS — BP 120/81 | HR 103 | Ht 65.0 in | Wt 140.4 lb

## 2017-03-15 DIAGNOSIS — R938 Abnormal findings on diagnostic imaging of other specified body structures: Secondary | ICD-10-CM

## 2017-03-15 DIAGNOSIS — I452 Bifascicular block: Secondary | ICD-10-CM | POA: Diagnosis not present

## 2017-03-15 DIAGNOSIS — Z8679 Personal history of other diseases of the circulatory system: Secondary | ICD-10-CM | POA: Diagnosis not present

## 2017-03-15 DIAGNOSIS — R011 Cardiac murmur, unspecified: Secondary | ICD-10-CM

## 2017-03-15 DIAGNOSIS — E785 Hyperlipidemia, unspecified: Secondary | ICD-10-CM | POA: Diagnosis not present

## 2017-03-15 DIAGNOSIS — R0989 Other specified symptoms and signs involving the circulatory and respiratory systems: Secondary | ICD-10-CM

## 2017-03-15 DIAGNOSIS — I10 Essential (primary) hypertension: Secondary | ICD-10-CM | POA: Diagnosis not present

## 2017-03-15 DIAGNOSIS — Z720 Tobacco use: Secondary | ICD-10-CM | POA: Diagnosis not present

## 2017-03-15 DIAGNOSIS — R0789 Other chest pain: Secondary | ICD-10-CM | POA: Diagnosis not present

## 2017-03-15 DIAGNOSIS — R9389 Abnormal findings on diagnostic imaging of other specified body structures: Secondary | ICD-10-CM

## 2017-03-15 MED ORDER — ATORVASTATIN CALCIUM 80 MG PO TABS: 80.0000 mg | ORAL_TABLET | Freq: Every day | ORAL | 5 refills | Status: DC

## 2017-03-15 NOTE — Patient Instructions (Signed)
Medication Instructions:   INCREASE Lipitor to 80mg  DAILY  A prescription has been sent to your pharmacy of record.  Labwork:   Lipid and liver function tests in 6 to 8 weeks. This is a fasting test. Please come back to the office to have this done at our internal [place] station. You do not need an appointment. Lab hours are Monday to Friday 8 am to 4:30pm - closed for lunch from 12:45 to 1:45pm  Testing/Procedures:  Your physician has requested that you have a carotid duplex. This test is an ultrasound of the carotid arteries in your neck. It looks at blood flow through these arteries that supply the brain with blood. Allow one hour for this exam. There are no restrictions or special instructions.  Your physician has requested that you have an echocardiogram. Echocardiography is a painless test that uses sound waves to create images of your heart. It provides your doctor with information about the size and shape of your heart and how well your heart's chambers and valves are working. This procedure takes approximately one hour. There are no restrictions for this procedure.  Your physician has requested that you have a lexiscan myoview. For further information please visit [email or web address]. Please follow instruction sheet, as given.      Follow-Up:  With Dr. Oval Linsey in 3 months  If you need a refill on your cardiac medications before your next appointment, please call your pharmacy.

## 2017-03-15 NOTE — Progress Notes (Signed)
Cardiology Office Note    Date:  03/15/2017   ID:  [name], Eye [birth year], MRN [id number]  PCP:  Amy Villanueva., PA-C  Cardiologist:  New - Case discussed with Dr. Oval Linsey  Chief Complaint  Patient presents with  . New Evaluation    case discussed with Dr. Oval Linsey DOD    History of Present Illness:  Amy Villanueva is a 66 y.o. female with PMH of HTN, HLD, insomnia, tobacco use. Last lipid panel obtained on 12/12/2016 showed total cholesterol 205, triglyceride 103, HDL 63, direct LDL 116. She had a chest x-ray in 2002 that showed aortic calcification. More recently, she has been bothered by a left lower rib pain and also upper back pain. She says she had chronic history of back pain due to multiple surgeries in the past. Both her rib pain and back pain are not exacerbated by exertion. Recent chest x-ray continued to show aortic calcification. Subsequent CT image revealed three-vessel disease. She was referred to cardiology service for cardiovascular risk stratification. She denies any dyspnea on exertion, she denies any lower extremity edema, orthopnea or PND.  On physical exam, she has a 1/6 murmur at the RUSB, she also have significant bruit noted in the left carotid area. I have discussed the case with DOD Dr. Oval Linsey, I recommended echocardiogram, carotid ultrasound and a Lexiscan stress test. She says due to her back pain, she is very likely going to have significant discomfort with fast ambulation. EKG obtained today showed a right bundle branch block, she was unaware of this. She is hardly on aspirin, we will increase her Lipitor to 80 mg daily. She will need a fasting lipid panel and LFTs in 6-8 weeks.    Past Medical History:  Diagnosis Date  . Arthritis RIGHT WRIST AND BACK  . Bladder cancer ([place])   . Depression   . Hematuria, gross   . History of cervical cancer S/P HYSTERECTOMY   . History of kidney stones   . Hypertension   . Insomnia   . PONV  (postoperative nausea and vomiting)     Past Surgical History:  Procedure Laterality Date  . ABDOMINAL HYSTERECTOMY  1983   W/ RSO  . CYSTOSCOPY WITH BIOPSY  10/23/2011   Procedure: CYSTOSCOPY WITH BIOPSY;  Surgeon: Reece Packer, MD;  Location: [facility];  Service: Urology;  Laterality: N/A;  BLADDER BIOPSY  . LAMINECTOMY AND MICRODISCECTOMY LUMBAR SPINE  08-02-2001   L5 - S1  . LAMINECTOMY/ DISKECTOMY/ POSTERIOR FUSION  09-23-2001   L3 - S1  . PERCUTANEOUS NEPHROSTOLITHOTOMY  1985   W/ POST-OP REPAIR URETER INJURY    Current Medications: Outpatient Medications Prior to Visit  Medication Sig Dispense Refill  . acetaminophen (TYLENOL) 500 MG tablet Take 500 mg by mouth every 6 (six) hours as needed.    Marland Kitchen omeprazole (PRILOSEC) 20 MG capsule Take 20 mg by mouth every morning.    . Triamterene-HCTZ (MAXZIDE PO) Take 1 tablet by mouth every morning.    . triazolam (HALCION) 0.25 MG tablet Take 0.25 mg by mouth at bedtime as needed.     Marland Kitchen atorvastatin (LIPITOR) 20 MG tablet Take 20 mg by mouth.     No facility-administered medications prior to visit.      Allergies:   Patient has no known allergies.   Social History   Social History  . Marital status: Single    Spouse name: N/A  . Number of children: N/A  . Years  of education: N/A   Social History Main Topics  . Smoking status: Light Tobacco Smoker    Packs/day: 1.00    Years: 37.00    Types: Cigarettes    Last attempt to quit: 10/17/2006  . Smokeless tobacco: Never Used     Comment: SMOKE SOCIALLY  . Alcohol use 0.0 oz/week     Comment: RARE  . Drug use: No  . Sexual activity: Not Asked   Other Topics Concern  . None   Social History Narrative  . None     Family History:  The patient's family history includes Anuerysm in her maternal grandmother; Breast cancer (age of onset: 71) in her mother; Heart disease in her father.   ROS:   Please see the history of present illness.    ROS All  other systems reviewed and are negative.   PHYSICAL EXAM:   VS:  BP 120/81   Pulse (!) 103   Ht 5\' 5"  (1.651 m)   Wt 140 lb 6.4 oz (63.7 kg)   SpO2 96%   BMI 23.36 kg/m    GEN: Well nourished, well developed, in no acute distress  HEENT: normal  Neck: no JVD, carotid bruits, or masses Cardiac: RRR; no murmurs, rubs, or gallops,no edema  Respiratory:  clear to auscultation bilaterally, normal work of breathing GI: soft, nontender, nondistended, + BS MS: no deformity or atrophy  Skin: warm and dry, no rash Neuro:  Alert and Oriented x 3, Strength and sensation are intact Psych: euthymic mood, full affect  Wt Readings from Last 3 Encounters:  03/15/17 140 lb 6.4 oz (63.7 kg)  01/13/16 145 lb (65.8 kg)  12/30/15 145 lb 3.2 oz (65.9 kg)      Studies/Labs Reviewed:   EKG:  EKG is ordered today.  The ekg ordered today demonstrates Normal sinus rhythm, right bundle branch block of unknown duration.  Recent Labs: No results found for requested labs within last 8760 hours.   Lipid Panel No results found for: CHOL, TRIG, HDL, CHOLHDL, VLDL, LDLCALC, LDLDIRECT  Additional studies/ records that were reviewed today include:   CT of chest wo contrast   IMPRESSION: 1. No mass, fluid collection or focal osseous abnormality at the area of symptomatic concern as indicated by the patient in the ventral lower left chest wall. Advise that the patient is up-to-date with screening mammography recommendations. 2. No active pulmonary disease. 3. Three-vessel coronary atherosclerosis   ASSESSMENT:    1. H/O coronary atherosclerosis   2. Abnormal CT of the chest   3. Hypertension, unspecified type   4. Left carotid bruit   5. Atypical chest pain   6. Heart murmur   7. Right BBB/left ant fasc block   8. Hyperlipidemia, unspecified hyperlipidemia type   9. Tobacco abuse      PLAN:  In order of problems listed above:  1. Coronary calcification seen on recent CT: She also has  atypical left rib pain, this discomfort does not seems to correlate with exertion. EKG showed right bundle branch block of unknown duration. I have discussed with DOD Dr. Oval Linsey, we plan to proceed with [facility]. She has chronic back pain and it does not think she can walk with treadmill. She is currently taking 81 mg aspirin daily.  2. Heart murmur: She has 1/6 systolic murmur at right upper sternal border, I will obtain echocardiogram.  3. Left carotid bruit: Will require carotid ultrasound.  4. Chronic right bundle branch block: Incidental finding, no prior EKG  to compare.  5. Tobacco abuse: She quit smoking in 2008, unfortunately started smoking again in recent years.  6. Hypertension: Blood pressure well controlled.  7. Hyperlipidemia: LDL still elevated, will increase Lipitor to 80 mg daily. She will need a fasting lipid panel and LFT    Medication Adjustments/Labs and Tests Ordered: Current medicines are reviewed at length with the patient today.  Concerns regarding medicines are outlined above.  Medication changes, Labs and Tests ordered today are listed in the Patient Instructions below. Patient Instructions  Medication Instructions:   INCREASE Lipitor to 80mg  DAILY  A prescription has been sent to your pharmacy of record.  Labwork:   Lipid and liver function tests in 6 to 8 weeks. This is a fasting test. Please come back to the office to have this done at our internal [place] station. You do not need an appointment. Lab hours are Monday to Friday 8 am to 4:30pm - closed for lunch from 12:45 to 1:45pm  Testing/Procedures:  Your physician has requested that you have a carotid duplex. This test is an ultrasound of the carotid arteries in your neck. It looks at blood flow through these arteries that supply the brain with blood. Allow one hour for this exam. There are no restrictions or special instructions.  Your physician has requested that you have an  echocardiogram. Echocardiography is a painless test that uses sound waves to create images of your heart. It provides your doctor with information about the size and shape of your heart and how well your heart's chambers and valves are working. This procedure takes approximately one hour. There are no restrictions for this procedure.  Your physician has requested that you have a lexiscan myoview. For further information please visit [email or web address]. Please follow instruction sheet, as given.      Follow-Up:  With Dr. Oval Linsey in 3 months  If you need a refill on your cardiac medications before your next appointment, please call your pharmacy.      Hilbert Corrigan, [place]  03/15/2017 5:03 PM    [facility] [address], [place], [place]  [address] Phone: [phone]; Fax: [phone]

## 2017-03-22 ENCOUNTER — Other Ambulatory Visit: Payer: Self-pay | Admitting: Physician Assistant

## 2017-03-22 DIAGNOSIS — Z8679 Personal history of other diseases of the circulatory system: Secondary | ICD-10-CM

## 2017-03-22 DIAGNOSIS — R0989 Other specified symptoms and signs involving the circulatory and respiratory systems: Secondary | ICD-10-CM

## 2017-03-28 ENCOUNTER — Telehealth (HOSPITAL_COMMUNITY): Payer: Self-pay

## 2017-03-28 NOTE — Telephone Encounter (Signed)
Encounter complete. 

## 2017-04-02 ENCOUNTER — Ambulatory Visit (HOSPITAL_COMMUNITY)
Admission: RE | Admit: 2017-04-02 | Discharge: 2017-04-02 | Disposition: A | Discharge status: Home / Self Care | Payer: PPO | Source: Ambulatory Visit | Attending: Cardiovascular Disease | Admitting: Cardiovascular Disease

## 2017-04-02 DIAGNOSIS — Z8679 Personal history of other diseases of the circulatory system: Secondary | ICD-10-CM | POA: Insufficient documentation

## 2017-04-02 DIAGNOSIS — I6523 Occlusion and stenosis of bilateral carotid arteries: Secondary | ICD-10-CM | POA: Diagnosis not present

## 2017-04-02 DIAGNOSIS — I779 Disorder of arteries and arterioles, unspecified: Secondary | ICD-10-CM | POA: Diagnosis not present

## 2017-04-02 DIAGNOSIS — Z8249 Family history of ischemic heart disease and other diseases of the circulatory system: Secondary | ICD-10-CM | POA: Diagnosis not present

## 2017-04-02 DIAGNOSIS — R0989 Other specified symptoms and signs involving the circulatory and respiratory systems: Secondary | ICD-10-CM

## 2017-04-02 DIAGNOSIS — R0789 Other chest pain: Secondary | ICD-10-CM | POA: Insufficient documentation

## 2017-04-02 DIAGNOSIS — I452 Bifascicular block: Secondary | ICD-10-CM | POA: Insufficient documentation

## 2017-04-02 DIAGNOSIS — I2510 Atherosclerotic heart disease of native coronary artery without angina pectoris: Secondary | ICD-10-CM | POA: Insufficient documentation

## 2017-04-02 DIAGNOSIS — R011 Cardiac murmur, unspecified: Secondary | ICD-10-CM

## 2017-04-02 DIAGNOSIS — R938 Abnormal findings on diagnostic imaging of other specified body structures: Secondary | ICD-10-CM | POA: Insufficient documentation

## 2017-04-02 DIAGNOSIS — I4510 Unspecified right bundle-branch block: Secondary | ICD-10-CM | POA: Diagnosis not present

## 2017-04-02 DIAGNOSIS — I10 Essential (primary) hypertension: Secondary | ICD-10-CM | POA: Diagnosis not present

## 2017-04-02 MED ORDER — TECHNETIUM TC 99M TETROFOSMIN IV KIT
32.1000 | PACK | Freq: Once | INTRAVENOUS | Status: AC | PRN
  Administered 2017-04-02: 32.1 via INTRAVENOUS
  Filled 2017-04-02: qty 33

## 2017-04-02 MED ORDER — TECHNETIUM TC 99M TETROFOSMIN IV KIT
10.5000 | PACK | Freq: Once | INTRAVENOUS | Status: AC | PRN
  Administered 2017-04-02: 10.5 via INTRAVENOUS
  Filled 2017-04-02: qty 11

## 2017-04-02 MED ORDER — REGADENOSON 0.4 MG/5ML IV SOLN
0.4000 mg | Freq: Once | INTRAVENOUS | Status: AC
  Administered 2017-04-02: 0.4 mg via INTRAVENOUS

## 2017-04-03 LAB — MYOCARDIAL PERFUSION IMAGING
CHL CUP NUCLEAR SDS: 2
CHL CUP NUCLEAR SRS: 0
CHL CUP RESTING HR STRESS: 71 {beats}/min
CSEPPHR: 103 {beats}/min
LV dias vol: 79 mL (ref 46–106)
LVSYSVOL: 31 mL
SSS: 2
TID: 0.97

## 2017-04-04 ENCOUNTER — Telehealth: Payer: Self-pay | Admitting: Physician Assistant

## 2017-04-04 NOTE — Telephone Encounter (Signed)
Patient states that she is returning call °

## 2017-04-04 NOTE — Telephone Encounter (Signed)
Left msg to call.

## 2017-04-15 ENCOUNTER — Other Ambulatory Visit: Payer: Self-pay

## 2017-04-15 ENCOUNTER — Ambulatory Visit (HOSPITAL_COMMUNITY): Payer: PPO | Attending: Cardiovascular Disease

## 2017-04-15 DIAGNOSIS — I2510 Atherosclerotic heart disease of native coronary artery without angina pectoris: Secondary | ICD-10-CM | POA: Insufficient documentation

## 2017-04-15 DIAGNOSIS — R0789 Other chest pain: Secondary | ICD-10-CM | POA: Diagnosis not present

## 2017-04-15 DIAGNOSIS — I10 Essential (primary) hypertension: Secondary | ICD-10-CM | POA: Insufficient documentation

## 2017-04-15 DIAGNOSIS — Z8679 Personal history of other diseases of the circulatory system: Secondary | ICD-10-CM | POA: Diagnosis not present

## 2017-04-15 DIAGNOSIS — R938 Abnormal findings on diagnostic imaging of other specified body structures: Secondary | ICD-10-CM | POA: Diagnosis not present

## 2017-04-15 DIAGNOSIS — I452 Bifascicular block: Secondary | ICD-10-CM | POA: Insufficient documentation

## 2017-04-15 DIAGNOSIS — R011 Cardiac murmur, unspecified: Secondary | ICD-10-CM | POA: Diagnosis not present

## 2017-04-15 DIAGNOSIS — R0989 Other specified symptoms and signs involving the circulatory and respiratory systems: Secondary | ICD-10-CM | POA: Diagnosis not present

## 2017-04-15 DIAGNOSIS — R9389 Abnormal findings on diagnostic imaging of other specified body structures: Secondary | ICD-10-CM

## 2017-04-17 ENCOUNTER — Telehealth: Payer: Self-pay | Admitting: Physician Assistant

## 2017-04-17 NOTE — Telephone Encounter (Signed)
Advised patient, verbalized understanding  

## 2017-04-17 NOTE — Telephone Encounter (Signed)
Notes recorded by Almyra Deforest, PA on 04/15/2017 at 4:02 PM EDT Despite mild heart murmur on physical exam, valve appears to be good. Mrs. Iafrate does have calcified mitral valve which likely occurs with age. Otherwise, normal pumping function of heart.

## 2017-04-17 NOTE — Telephone Encounter (Signed)
Patient returning call for results. Patient asks if you could please call before 10am. Patient goes to work @ 10am.

## 2017-06-03 ENCOUNTER — Encounter: Payer: Self-pay | Admitting: *Deleted

## 2017-06-11 ENCOUNTER — Ambulatory Visit: Payer: PPO | Admitting: Cardiovascular Disease

## 2017-07-08 DIAGNOSIS — R0989 Other specified symptoms and signs involving the circulatory and respiratory systems: Secondary | ICD-10-CM | POA: Diagnosis not present

## 2017-07-08 DIAGNOSIS — R011 Cardiac murmur, unspecified: Secondary | ICD-10-CM | POA: Diagnosis not present

## 2017-07-08 DIAGNOSIS — I452 Bifascicular block: Secondary | ICD-10-CM | POA: Diagnosis not present

## 2017-07-08 DIAGNOSIS — Z8679 Personal history of other diseases of the circulatory system: Secondary | ICD-10-CM | POA: Diagnosis not present

## 2017-07-08 DIAGNOSIS — M79644 Pain in right finger(s): Secondary | ICD-10-CM | POA: Diagnosis not present

## 2017-07-08 DIAGNOSIS — I10 Essential (primary) hypertension: Secondary | ICD-10-CM | POA: Diagnosis not present

## 2017-07-08 DIAGNOSIS — M1811 Unilateral primary osteoarthritis of first carpometacarpal joint, right hand: Secondary | ICD-10-CM | POA: Diagnosis not present

## 2017-07-08 DIAGNOSIS — R9389 Abnormal findings on diagnostic imaging of other specified body structures: Secondary | ICD-10-CM | POA: Diagnosis not present

## 2017-07-08 DIAGNOSIS — R0789 Other chest pain: Secondary | ICD-10-CM | POA: Diagnosis not present

## 2017-07-08 LAB — LIPID PANEL
CHOL/HDL RATIO: 2.9 ratio (ref 0.0–4.4)
Cholesterol, Total: 170 mg/dL (ref 100–199)
HDL: 59 mg/dL (ref >39)
LDL CALC: 94 mg/dL (ref 0–99)
Triglycerides: 87 mg/dL (ref 0–149)
VLDL CHOLESTEROL CAL: 17 mg/dL (ref 5–40)

## 2017-07-08 LAB — HEPATIC FUNCTION PANEL
ALBUMIN: 4.2 g/dL (ref 3.6–4.8)
ALT: 15 IU/L (ref 0–32)
AST: 18 IU/L (ref 0–40)
Alkaline Phosphatase: 90 IU/L (ref 39–117)
BILIRUBIN TOTAL: 1 mg/dL (ref 0.0–1.2)
Bilirubin, Direct: 0.22 mg/dL (ref 0.00–0.40)
TOTAL PROTEIN: 6.6 g/dL (ref 6.0–8.5)

## 2017-07-22 ENCOUNTER — Other Ambulatory Visit: Payer: Self-pay | Admitting: Pharmacy Technician

## 2017-07-22 NOTE — Patient Outreach (Signed)
[facility]) Care Management  07/22/2017  Amy Villanueva 1950/11/13 [id number]  Incoming Emmi call in reference to medication adherence for Atorvastatin 20 mg. HIPAA identifiers verified and verbal consent received. The patient was initially prescribed Atorvastatin 20 mg by Dr. Deatra Ina her PCP. The patient later had an appointment with Dr. Eulas Post her cardiologist and was increased to Atorvastatin 80 mg. She is now taking it once daily as prescribed and will have Dr. Deatra Ina to write her a new prescription with additional refills. We also discussed her co-pay on another medication which is $45 and I informed her that she could get a 3 month supply for $90. She is going to ask Dr. Deatra Ina to also write a new prescription for that medication.  Doreene Burke, [place] [phone]

## 2017-10-30 IMAGING — NM NM MISC PROCEDURE
9 series · 54 of 54 positions shown · non-contrast
Comparison: none

[Series 1: wbr rest · 6.40mm/px · 6 of 64 frames shown]
[frame 6/64]
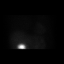
[frame 16/64]
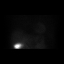
[frame 27/64]
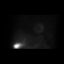
[frame 38/64]
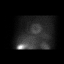
[frame 48/64]
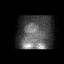
[frame 59/64]
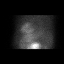

[Series 1: rest sax · 6.4mm · 6.40mm/px · 6 of 23 frames shown]
[frame 2/23]
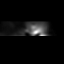
[frame 6/23]
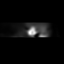
[frame 10/23]
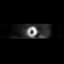
[frame 14/23]
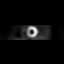
[frame 18/23]
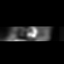
[frame 22/23]
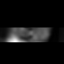

[Series 1: wbr_r-proj_st wbr rest · 6.40mm/px · 6 of 64 frames shown]
[frame 6/64]
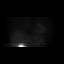
[frame 16/64]
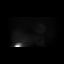
[frame 27/64]
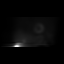
[frame 38/64]
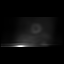
[frame 48/64]
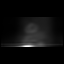
[frame 59/64]
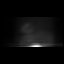

[Series 2: stress sax gs · 6.4mm · 6.40mm/px · 6 of 184 frames shown]
[frame 16/184]
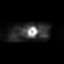
[frame 46/184]
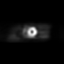
[frame 77/184]
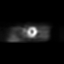
[frame 108/184]
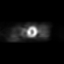
[frame 138/184]
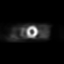
[frame 169/184]
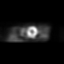

[Series 2: stress sax · 6.4mm · 6.40mm/px · 6 of 23 frames shown]
[frame 2/23]
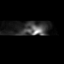
[frame 6/23]
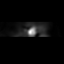
[frame 10/23]
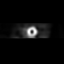
[frame 14/23]
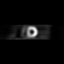
[frame 18/23]
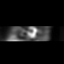
[frame 22/23]
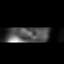

[Series 2: wbr_s-proj_st wbr stress-gsp · 6.40mm/px · 6 of 512 frames shown]
[frame 43/512]
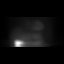
[frame 128/512]
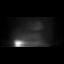
[frame 214/512]
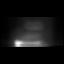
[frame 299/512]
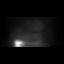
[frame 384/512]
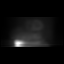
[frame 470/512]
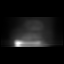

[Series 2: wbr stress-gsp · 6.40mm/px · 6 of 512 frames shown]
[frame 43/512]
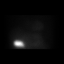
[frame 128/512]
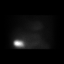
[frame 214/512]
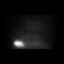
[frame 299/512]
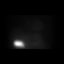
[frame 384/512]
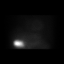
[frame 470/512]
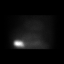

[Series 3: wbr stress-sum-em · 6.40mm/px · 6 of 64 frames shown]
[frame 6/64]
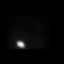
[frame 16/64]
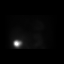
[frame 27/64]
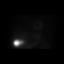
[frame 38/64]
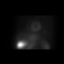
[frame 48/64]
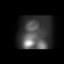
[frame 59/64]
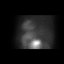

[Series 3: wbr_s-proj_st wbr stress-sum-em · 6.40mm/px · 6 of 64 frames shown]
[frame 6/64]
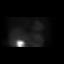
[frame 16/64]
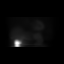
[frame 27/64]
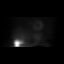
[frame 38/64]
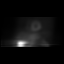
[frame 48/64]
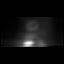
[frame 59/64]
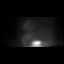

[54 of 54 positions shown; findings below may reference images not displayed]

Canned report from images found in remote index.

Refer to host system for actual result text.

## 2018-02-26 DIAGNOSIS — M1811 Unilateral primary osteoarthritis of first carpometacarpal joint, right hand: Secondary | ICD-10-CM | POA: Diagnosis not present

## 2018-02-26 DIAGNOSIS — M189 Osteoarthritis of first carpometacarpal joint, unspecified: Secondary | ICD-10-CM | POA: Insufficient documentation

## 2018-02-26 DIAGNOSIS — M79644 Pain in right finger(s): Secondary | ICD-10-CM | POA: Diagnosis not present

## 2018-03-21 DIAGNOSIS — R7301 Impaired fasting glucose: Secondary | ICD-10-CM | POA: Diagnosis not present

## 2018-03-21 DIAGNOSIS — I10 Essential (primary) hypertension: Secondary | ICD-10-CM | POA: Diagnosis not present

## 2018-03-21 DIAGNOSIS — E7800 Pure hypercholesterolemia, unspecified: Secondary | ICD-10-CM | POA: Diagnosis not present

## 2018-03-24 DIAGNOSIS — R7301 Impaired fasting glucose: Secondary | ICD-10-CM | POA: Diagnosis not present

## 2018-03-24 DIAGNOSIS — G4700 Insomnia, unspecified: Secondary | ICD-10-CM | POA: Diagnosis not present

## 2018-03-24 DIAGNOSIS — Z23 Encounter for immunization: Secondary | ICD-10-CM | POA: Diagnosis not present

## 2018-03-24 DIAGNOSIS — Z0000 Encounter for general adult medical examination without abnormal findings: Secondary | ICD-10-CM | POA: Diagnosis not present

## 2018-03-24 DIAGNOSIS — E7800 Pure hypercholesterolemia, unspecified: Secondary | ICD-10-CM | POA: Diagnosis not present

## 2018-03-24 DIAGNOSIS — Z1231 Encounter for screening mammogram for malignant neoplasm of breast: Secondary | ICD-10-CM | POA: Diagnosis not present

## 2018-03-24 DIAGNOSIS — I10 Essential (primary) hypertension: Secondary | ICD-10-CM | POA: Diagnosis not present

## 2018-04-07 DIAGNOSIS — Z1231 Encounter for screening mammogram for malignant neoplasm of breast: Secondary | ICD-10-CM | POA: Diagnosis not present

## 2018-04-07 DIAGNOSIS — Z803 Family history of malignant neoplasm of breast: Secondary | ICD-10-CM | POA: Diagnosis not present

## 2018-04-21 ENCOUNTER — Ambulatory Visit: Payer: PPO | Admitting: Cardiovascular Disease

## 2018-05-12 ENCOUNTER — Ambulatory Visit: Payer: PPO | Admitting: Cardiovascular Disease

## 2018-08-19 DIAGNOSIS — J018 Other acute sinusitis: Secondary | ICD-10-CM | POA: Diagnosis not present

## 2018-08-19 DIAGNOSIS — R05 Cough: Secondary | ICD-10-CM | POA: Diagnosis not present

## 2018-11-07 DIAGNOSIS — J329 Chronic sinusitis, unspecified: Secondary | ICD-10-CM | POA: Diagnosis not present

## 2019-01-29 ENCOUNTER — Encounter: Payer: Self-pay | Admitting: Internal Medicine

## 2019-03-02 ENCOUNTER — Other Ambulatory Visit: Payer: Self-pay

## 2019-03-02 ENCOUNTER — Ambulatory Visit (AMBULATORY_SURGERY_CENTER): Payer: Self-pay

## 2019-03-02 VITALS — Ht 65.0 in | Wt 145.0 lb

## 2019-03-02 DIAGNOSIS — Z86010 Personal history of colonic polyps: Secondary | ICD-10-CM

## 2019-03-02 MED ORDER — NA SULFATE-K SULFATE-MG SULF 17.5-3.13-1.6 GM/177ML PO SOLN: 1.0000 | Freq: Once | ORAL | 0 refills | Status: AC

## 2019-03-02 NOTE — Progress Notes (Signed)
Denies allergies to eggs or soy products. Denies complication of anesthesia or sedation. Denies use of weight loss medication. Denies use of O2.   Emmi instructions given for colonoscopy.  Pre-Visit was conducted by phone due to Covid 19. Instructions were reviewed and mailed to patients confirmed home address. A sample of Suprep was given to the patient. Patient was encouraged to call if she had any questions or concerns regarding instructions.

## 2019-03-03 ENCOUNTER — Encounter: Payer: Self-pay | Admitting: Internal Medicine

## 2019-03-06 ENCOUNTER — Telehealth: Payer: Self-pay | Admitting: Internal Medicine

## 2019-03-06 NOTE — Telephone Encounter (Signed)

## 2019-03-09 ENCOUNTER — Encounter: Payer: Self-pay | Admitting: Internal Medicine

## 2019-03-09 ENCOUNTER — Other Ambulatory Visit: Payer: Self-pay

## 2019-03-09 ENCOUNTER — Ambulatory Visit (AMBULATORY_SURGERY_CENTER): Payer: PPO | Admitting: Internal Medicine

## 2019-03-09 VITALS — BP 163/77 | HR 71 | Temp 98.5°F | Resp 12 | Ht 65.0 in | Wt 145.0 lb

## 2019-03-09 DIAGNOSIS — D128 Benign neoplasm of rectum: Secondary | ICD-10-CM

## 2019-03-09 DIAGNOSIS — K621 Rectal polyp: Secondary | ICD-10-CM | POA: Diagnosis not present

## 2019-03-09 DIAGNOSIS — K635 Polyp of colon: Secondary | ICD-10-CM | POA: Diagnosis not present

## 2019-03-09 DIAGNOSIS — Z86010 Personal history of colonic polyps: Secondary | ICD-10-CM | POA: Diagnosis not present

## 2019-03-09 DIAGNOSIS — Z1211 Encounter for screening for malignant neoplasm of colon: Secondary | ICD-10-CM | POA: Diagnosis not present

## 2019-03-09 DIAGNOSIS — D124 Benign neoplasm of descending colon: Secondary | ICD-10-CM

## 2019-03-09 MED ORDER — SODIUM CHLORIDE 0.9 % IV SOLN: 500.0000 mL | Freq: Once | INTRAVENOUS | Status: DC

## 2019-03-09 NOTE — Patient Instructions (Signed)
YOU HAD AN ENDOSCOPIC PROCEDURE TODAY AT [facility]:   Refer to the procedure report that was given to you for any specific questions about what was found during the examination.  If the procedure report does not answer your questions, please call your gastroenterologist to clarify.  If you requested that your care partner not be given the details of your procedure findings, then the procedure report has been included in a sealed envelope for you to review at your convenience later.  YOU SHOULD EXPECT: Some feelings of bloating in the abdomen. Passage of more gas than usual.  Walking can help get rid of the air that was put into your GI tract during the procedure and reduce the bloating. If you had a lower endoscopy (such as a colonoscopy or flexible sigmoidoscopy) you may notice spotting of blood in your stool or on the toilet paper. If you underwent a bowel prep for your procedure, you may not have a normal bowel movement for a few days.  Please Note:  You might notice some irritation and congestion in your nose or some drainage.  This is from the oxygen used during your procedure.  There is no need for concern and it should clear up in a day or so.  SYMPTOMS TO REPORT IMMEDIATELY:   Following lower endoscopy (colonoscopy or flexible sigmoidoscopy):  Excessive amounts of blood in the stool  Significant tenderness or worsening of abdominal pains  Swelling of the abdomen that is new, acute  Fever of 100F or higher   For urgent or emergent issues, a gastroenterologist can be reached at any hour by calling [phone].   DIET:  We do recommend a small meal at first, but then you may proceed to your regular diet.  Drink plenty of fluids but you should avoid alcoholic beverages for 24 hours.  ACTIVITY:  You should plan to take it easy for the rest of today and you should NOT DRIVE or use heavy machinery until tomorrow (because of the sedation medicines used during the test).     FOLLOW UP: Our staff will call the number listed on your records 48-72 hours following your procedure to check on you and address any questions or concerns that you may have regarding the information given to you following your procedure. If we do not reach you, we will leave a message.  We will attempt to reach you two times.  During this call, we will ask if you have developed any symptoms of COVID 19. If you develop any symptoms (ie: fever, flu-like symptoms, shortness of breath, cough etc.) before then, please call [phone].  If you test positive for Covid 19 in the 2 weeks post procedure, please call and report this information to [place].    If any biopsies were taken you will be contacted by phone or by letter within the next 1-3 weeks.  Please call us at [phone] if you have not heard about the biopsies in 3 weeks.    SIGNATURES/CONFIDENTIALITY: You and/or your care partner have signed paperwork which will be entered into your electronic medical record.  These signatures attest to the fact that that the information above on your After Visit Summary has been reviewed and is understood.  Full responsibility of the confidentiality of this discharge information lies with you and/or your care-partner.    Handouts were given to your care on polyps, diverticulosis, and hemorrhoids. You may resume your current medications today. Await biopsy results. Repeat colonoscopy in  5 years for surveillance. Please call if any questions or concerns.

## 2019-03-09 NOTE — Progress Notes (Signed)
Called to room to assist during endoscopic procedure.  Patient ID and intended procedure confirmed with present staff. Received instructions for my participation in the procedure from the performing physician.  

## 2019-03-09 NOTE — Progress Notes (Addendum)
No problems noted in the recovery room. Maw  Pt has not passed flatus while in the recovery room even though she was encouraged to.  He abdomin is soft and she denies and discomfort.  I told pt after her 20 minutes was up on the monitor, she could go to the restroom and see if she could pass flatus. Maw  Pt was able to pass flatus sitting on the toilet.  Ready for discharge to home.  maw

## 2019-03-09 NOTE — Op Note (Signed)
[place] Patient Name: Byron Tipping Procedure Date: 03/09/2019 10:58 AM MRN: [id number] Endoscopist: Docia Chuck. Henrene Pastor , MD Age: 68 Referring MD:  Date of Birth: [birth year] Gender: Female Account #: [id number] Procedure:                Colonoscopy with cold snare polypectomy x 1; with                            biopsies Indications:              High risk colon cancer surveillance: Personal                            history of multiple (3 or more) adenomas. Previous                            examinations 2012 and 2017 Medicines:                Monitored Anesthesia Care Procedure:                Pre-Anesthesia Assessment:                           - Prior to the procedure, a History and Physical                            was performed, and patient medications and                            allergies were reviewed. The patient's tolerance of                            previous anesthesia was also reviewed. The risks                            and benefits of the procedure and the sedation                            options and risks were discussed with the patient.                            All questions were answered, and informed consent                            was obtained. Prior Anticoagulants: The patient has                            taken no previous anticoagulant or antiplatelet                            agents. ASA Grade Assessment: II - A patient with                            mild systemic disease. After reviewing the risks  and benefits, the patient was deemed in                            satisfactory condition to undergo the procedure.                           After obtaining informed consent, the colonoscope                            was passed under direct vision. Throughout the                            procedure, the patient's blood pressure, pulse, and                            oxygen saturations were monitored  continuously. The                            Colonoscope was introduced through the anus and                            advanced to the the cecum, identified by                            appendiceal orifice and ileocecal valve. The                            ileocecal valve, appendiceal orifice, and rectum                            were photographed. The quality of the bowel                            preparation was excellent. The colonoscopy was                            performed without difficulty. The patient tolerated                            the procedure well. The bowel preparation used was                            SUPREP via split dose instruction. Scope In: 11:06:01 AM Scope Out: 11:19:04 AM Scope Withdrawal Time: 0 hours 10 minutes 30 seconds  Total Procedure Duration: 0 hours 13 minutes 3 seconds  Findings:                 A 1 mm polyp was found in the rectum. The polyp was                            removed with a jumbo cold forceps. Resection and                            retrieval were complete.  A 3 mm polyp was found in the transverse colon. The                            polyp was sessile. The polyp was removed with a                            cold snare. Resection and retrieval were complete.                           Multiple diverticula were found in the sigmoid                            colon. Small nonbleeding AVM in the right colon.                           Internal hemorrhoids were found during retroflexion.                           The exam was otherwise without abnormality on                            direct and retroflexion views. Complications:            No immediate complications. Estimated blood loss:                            None. Estimated Blood Loss:     Estimated blood loss: none. Impression:               - One 1 mm polyp in the rectum, removed with a                            jumbo cold forceps. Resected and  retrieved.                           - One 3 mm polyp in the transverse colon, removed                            with a cold snare. Resected and retrieved.                           - Diverticulosis in the sigmoid colon. Small                            ascending colon AVM.                           - Internal hemorrhoids.                           - The examination was otherwise normal on direct                            and retroflexion views. Recommendation:           -  Repeat colonoscopy in 5 years for surveillance.                           - Patient has a contact number available for                            emergencies. The signs and symptoms of potential                            delayed complications were discussed with the                            patient. Return to normal activities tomorrow.                            Written discharge instructions were provided to the                            patient.                           - Resume previous diet.                           - Continue present medications.                           - Await pathology results. Docia Chuck. Henrene Pastor, MD 03/09/2019 11:25:56 AM This report has been signed electronically.

## 2019-03-09 NOTE — Progress Notes (Signed)
PT taken to PACU. Monitors in place. VSS. Report given to RN. 

## 2019-03-11 ENCOUNTER — Encounter: Payer: Self-pay | Admitting: Internal Medicine

## 2019-03-11 ENCOUNTER — Telehealth: Payer: Self-pay

## 2019-03-11 NOTE — Telephone Encounter (Signed)
  Follow up Call-  Call back number 03/09/2019  Post procedure Call Back phone  # [phone]  Permission to leave phone message Yes  Some recent data might be hidden     Patient questions:  Do you have a fever, pain , or abdominal swelling? No. Pain Score  0 *  Have you tolerated food without any problems? Yes.    Have you been able to return to your normal activities? Yes.    Do you have any questions about your discharge instructions: Diet   No. Medications  No. Follow up visit  No.  Do you have questions or concerns about your Care? No.  Actions: * If pain score is 4 or above: No action needed, pain <4.  1. Have you developed a fever since your procedure? no  2.   Have you had an respiratory symptoms (SOB or cough) since your procedure? no  3.   Have you tested positive for COVID 19 since your procedure no  4.   Have you had any family members/close contacts diagnosed with the COVID 19 since your procedure?  no   If yes to any of these questions please route to Joylene John, RN and Alphonsa Gin, Therapist, sports.

## 2019-03-20 DIAGNOSIS — R7301 Impaired fasting glucose: Secondary | ICD-10-CM | POA: Diagnosis not present

## 2019-03-20 DIAGNOSIS — I10 Essential (primary) hypertension: Secondary | ICD-10-CM | POA: Diagnosis not present

## 2019-03-20 DIAGNOSIS — E7800 Pure hypercholesterolemia, unspecified: Secondary | ICD-10-CM | POA: Diagnosis not present

## 2019-03-23 DIAGNOSIS — R7301 Impaired fasting glucose: Secondary | ICD-10-CM | POA: Diagnosis not present

## 2019-03-23 DIAGNOSIS — E7800 Pure hypercholesterolemia, unspecified: Secondary | ICD-10-CM | POA: Diagnosis not present

## 2019-03-23 DIAGNOSIS — I10 Essential (primary) hypertension: Secondary | ICD-10-CM | POA: Diagnosis not present

## 2019-03-23 DIAGNOSIS — Z0000 Encounter for general adult medical examination without abnormal findings: Secondary | ICD-10-CM | POA: Diagnosis not present

## 2019-03-23 DIAGNOSIS — G4700 Insomnia, unspecified: Secondary | ICD-10-CM | POA: Diagnosis not present

## 2019-06-19 DIAGNOSIS — Z803 Family history of malignant neoplasm of breast: Secondary | ICD-10-CM | POA: Diagnosis not present

## 2019-06-19 DIAGNOSIS — Z1231 Encounter for screening mammogram for malignant neoplasm of breast: Secondary | ICD-10-CM | POA: Diagnosis not present

## 2019-09-18 DIAGNOSIS — M5442 Lumbago with sciatica, left side: Secondary | ICD-10-CM | POA: Diagnosis not present

## 2019-09-18 DIAGNOSIS — M25559 Pain in unspecified hip: Secondary | ICD-10-CM | POA: Diagnosis not present

## 2019-09-18 DIAGNOSIS — M961 Postlaminectomy syndrome, not elsewhere classified: Secondary | ICD-10-CM | POA: Diagnosis not present

## 2019-09-18 DIAGNOSIS — G8929 Other chronic pain: Secondary | ICD-10-CM | POA: Diagnosis not present

## 2019-09-18 DIAGNOSIS — M5441 Lumbago with sciatica, right side: Secondary | ICD-10-CM | POA: Diagnosis not present

## 2019-09-22 ENCOUNTER — Ambulatory Visit: Payer: PPO

## 2019-10-02 DIAGNOSIS — M5441 Lumbago with sciatica, right side: Secondary | ICD-10-CM | POA: Diagnosis not present

## 2019-10-02 DIAGNOSIS — M461 Sacroiliitis, not elsewhere classified: Secondary | ICD-10-CM | POA: Diagnosis not present

## 2019-10-09 ENCOUNTER — Ambulatory Visit: Payer: PPO

## 2019-10-17 ENCOUNTER — Ambulatory Visit: Payer: PPO | Attending: Internal Medicine

## 2019-10-17 DIAGNOSIS — Z23 Encounter for immunization: Secondary | ICD-10-CM

## 2019-10-17 NOTE — Progress Notes (Signed)
   Covid-19 Vaccination Clinic  Name:  Amy Villanueva    MRN: [id number] DOB: 11-Apr-1951  10/17/2019  [name] was observed post Covid-19 immunization for 15 minutes without incidence. She was provided with Vaccine Information Sheet and instruction to access the V-Safe system.   [name] was instructed to call 911 with any severe reactions post vaccine: Marland Kitchen Difficulty breathing  . Swelling of your face and throat  . A fast heartbeat  . A bad rash all over your body  . Dizziness and weakness    Immunizations Administered    Name Date Dose VIS Date Route   Pfizer COVID-19 Vaccine 10/17/2019  3:36 PM 0.3 mL 08/07/2019 Intramuscular   Manufacturer: [place]   Lot: [id number]   [place]: [id number]

## 2019-11-10 ENCOUNTER — Ambulatory Visit: Payer: PPO | Attending: Internal Medicine

## 2019-11-10 DIAGNOSIS — Z23 Encounter for immunization: Secondary | ICD-10-CM

## 2019-11-10 NOTE — Progress Notes (Signed)
   Covid-19 Vaccination Clinic  Name:  [name]    MRN: [id number] DOB: 04/01/1951  11/10/2019  Amy Villanueva was observed post Covid-19 immunization for 15 minutes without incident. She was provided with Vaccine Information Sheet and instruction to access the V-Safe system.   Amy Villanueva was instructed to call 911 with any severe reactions post vaccine: Marland Kitchen Difficulty breathing  . Swelling of face and throat  . A fast heartbeat  . A bad rash all over body  . Dizziness and weakness   Immunizations Administered    Name Date Dose VIS Date Route   Pfizer COVID-19 Vaccine 11/10/2019  3:11 PM 0.3 mL 08/07/2019 Intramuscular   Manufacturer: [place]   Lot: [id number]   [place]: [id number]

## 2019-11-25 DIAGNOSIS — M461 Sacroiliitis, not elsewhere classified: Secondary | ICD-10-CM | POA: Diagnosis not present

## 2020-03-25 DIAGNOSIS — I10 Essential (primary) hypertension: Secondary | ICD-10-CM | POA: Diagnosis not present

## 2020-03-25 DIAGNOSIS — E7800 Pure hypercholesterolemia, unspecified: Secondary | ICD-10-CM | POA: Diagnosis not present

## 2020-03-25 DIAGNOSIS — R7301 Impaired fasting glucose: Secondary | ICD-10-CM | POA: Diagnosis not present

## 2020-03-28 DIAGNOSIS — E7800 Pure hypercholesterolemia, unspecified: Secondary | ICD-10-CM | POA: Diagnosis not present

## 2020-03-28 DIAGNOSIS — I10 Essential (primary) hypertension: Secondary | ICD-10-CM | POA: Diagnosis not present

## 2020-03-28 DIAGNOSIS — G4700 Insomnia, unspecified: Secondary | ICD-10-CM | POA: Diagnosis not present

## 2020-03-28 DIAGNOSIS — Z0000 Encounter for general adult medical examination without abnormal findings: Secondary | ICD-10-CM | POA: Diagnosis not present

## 2020-03-28 DIAGNOSIS — R7301 Impaired fasting glucose: Secondary | ICD-10-CM | POA: Diagnosis not present

## 2020-04-22 DIAGNOSIS — M461 Sacroiliitis, not elsewhere classified: Secondary | ICD-10-CM | POA: Diagnosis not present

## 2020-07-01 DIAGNOSIS — Z1231 Encounter for screening mammogram for malignant neoplasm of breast: Secondary | ICD-10-CM | POA: Diagnosis not present

## 2020-07-15 DIAGNOSIS — I10 Essential (primary) hypertension: Secondary | ICD-10-CM | POA: Diagnosis not present

## 2020-07-15 DIAGNOSIS — M461 Sacroiliitis, not elsewhere classified: Secondary | ICD-10-CM | POA: Diagnosis not present

## 2020-08-10 DIAGNOSIS — M461 Sacroiliitis, not elsewhere classified: Secondary | ICD-10-CM | POA: Diagnosis not present

## 2020-08-10 DIAGNOSIS — I10 Essential (primary) hypertension: Secondary | ICD-10-CM | POA: Diagnosis not present

## 2020-09-30 DIAGNOSIS — M461 Sacroiliitis, not elsewhere classified: Secondary | ICD-10-CM | POA: Diagnosis not present

## 2020-09-30 DIAGNOSIS — I10 Essential (primary) hypertension: Secondary | ICD-10-CM | POA: Diagnosis not present

## 2021-01-06 DIAGNOSIS — M7541 Impingement syndrome of right shoulder: Secondary | ICD-10-CM | POA: Diagnosis not present

## 2021-01-06 DIAGNOSIS — M7542 Impingement syndrome of left shoulder: Secondary | ICD-10-CM | POA: Diagnosis not present

## 2021-02-03 DIAGNOSIS — M461 Sacroiliitis, not elsewhere classified: Secondary | ICD-10-CM | POA: Diagnosis not present

## 2021-03-31 DIAGNOSIS — R7301 Impaired fasting glucose: Secondary | ICD-10-CM | POA: Diagnosis not present

## 2021-03-31 DIAGNOSIS — I10 Essential (primary) hypertension: Secondary | ICD-10-CM | POA: Diagnosis not present

## 2021-03-31 DIAGNOSIS — E7800 Pure hypercholesterolemia, unspecified: Secondary | ICD-10-CM | POA: Diagnosis not present

## 2021-04-03 DIAGNOSIS — E7800 Pure hypercholesterolemia, unspecified: Secondary | ICD-10-CM | POA: Diagnosis not present

## 2021-04-03 DIAGNOSIS — G4700 Insomnia, unspecified: Secondary | ICD-10-CM | POA: Diagnosis not present

## 2021-04-03 DIAGNOSIS — R7301 Impaired fasting glucose: Secondary | ICD-10-CM | POA: Diagnosis not present

## 2021-04-03 DIAGNOSIS — M545 Low back pain, unspecified: Secondary | ICD-10-CM | POA: Diagnosis not present

## 2021-04-03 DIAGNOSIS — I10 Essential (primary) hypertension: Secondary | ICD-10-CM | POA: Diagnosis not present

## 2021-04-03 DIAGNOSIS — Z0000 Encounter for general adult medical examination without abnormal findings: Secondary | ICD-10-CM | POA: Diagnosis not present

## 2021-04-10 DIAGNOSIS — M25512 Pain in left shoulder: Secondary | ICD-10-CM | POA: Diagnosis not present

## 2021-04-10 DIAGNOSIS — M7542 Impingement syndrome of left shoulder: Secondary | ICD-10-CM | POA: Diagnosis not present

## 2023-04-22 ENCOUNTER — Encounter: Payer: Self-pay | Admitting: Family Medicine

## 2023-04-22 ENCOUNTER — Other Ambulatory Visit: Payer: Self-pay | Admitting: Family Medicine

## 2023-04-22 DIAGNOSIS — F17210 Nicotine dependence, cigarettes, uncomplicated: Secondary | ICD-10-CM

## 2023-04-26 ENCOUNTER — Ambulatory Visit
Admission: RE | Admit: 2023-04-26 | Discharge: 2023-04-26 | Disposition: A | Discharge status: Home / Self Care | Payer: PPO | Source: Ambulatory Visit | Attending: Family Medicine | Admitting: Family Medicine

## 2023-04-26 DIAGNOSIS — F17210 Nicotine dependence, cigarettes, uncomplicated: Secondary | ICD-10-CM

## 2023-06-18 ENCOUNTER — Telehealth: Payer: Self-pay | Admitting: Internal Medicine

## 2023-06-18 NOTE — Telephone Encounter (Signed)
Pt reports she has been referred for gastritis and she thinks she may have an ulcer. Reports she may start vomiting and do that for 24 hours before she stops. States she has been having these issues for 3 months. Pt scheduled to see Alcide Evener NP 06/26/23 at 8:30am. Pt aware of appt. States if she has another "attack" prior to the appt she may just have to go to the ER, discussed with her that she should go to the ER if she feels that bad.

## 2023-06-18 NOTE — Telephone Encounter (Addendum)
Patient called regarding a referral received for acute gastritis w\o hemorrhage. Stated she needs to be seen sooner because her symptoms are getting worst she was offered the next available but she declined and asked to discuss further with a nurse.

## 2023-06-26 ENCOUNTER — Ambulatory Visit: Payer: PPO | Admitting: Nurse Practitioner

## 2023-06-28 DEATH — deceased
